# Patient Record
Sex: Female | Born: 1950 | Race: White | Hispanic: No | Marital: Single | Smoking: Current every day smoker
Health system: Southern US, Community
[De-identification: ages and names within clinical notes are randomized; demographics above are authoritative.]

## PROBLEM LIST (undated history)

## (undated) DIAGNOSIS — Z1389 Encounter for screening for other disorder: Secondary | ICD-10-CM

## (undated) DIAGNOSIS — K469 Unspecified abdominal hernia without obstruction or gangrene: Secondary | ICD-10-CM

## (undated) DIAGNOSIS — F329 Major depressive disorder, single episode, unspecified: Secondary | ICD-10-CM

## (undated) DIAGNOSIS — Z87891 Personal history of nicotine dependence: Secondary | ICD-10-CM

## (undated) DIAGNOSIS — E669 Obesity, unspecified: Secondary | ICD-10-CM

## (undated) DIAGNOSIS — Z1239 Encounter for other screening for malignant neoplasm of breast: Secondary | ICD-10-CM

## (undated) DIAGNOSIS — I10 Essential (primary) hypertension: Secondary | ICD-10-CM

## (undated) DIAGNOSIS — F319 Bipolar disorder, unspecified: Secondary | ICD-10-CM

## (undated) DIAGNOSIS — Z8739 Personal history of other diseases of the musculoskeletal system and connective tissue: Secondary | ICD-10-CM

## (undated) DIAGNOSIS — M1990 Unspecified osteoarthritis, unspecified site: Secondary | ICD-10-CM

## (undated) DIAGNOSIS — N6019 Diffuse cystic mastopathy of unspecified breast: Secondary | ICD-10-CM

## (undated) DIAGNOSIS — Z803 Family history of malignant neoplasm of breast: Secondary | ICD-10-CM

## (undated) DIAGNOSIS — E349 Endocrine disorder, unspecified: Secondary | ICD-10-CM

## (undated) DIAGNOSIS — N63 Unspecified lump in unspecified breast: Secondary | ICD-10-CM

## (undated) HISTORY — DX: Major depressive disorder, single episode, unspecified: F32.9

## (undated) HISTORY — DX: Bipolar disorder, unspecified: F31.9

## (undated) HISTORY — DX: Personal history of nicotine dependence: Z87.891

## (undated) HISTORY — DX: Unspecified abdominal hernia without obstruction or gangrene: K46.9

## (undated) HISTORY — DX: Obesity, unspecified: E66.9

## (undated) HISTORY — DX: Encounter for screening for other disorder: Z13.89

## (undated) HISTORY — DX: Endocrine disorder, unspecified: E34.9

## (undated) HISTORY — DX: Essential (primary) hypertension: I10

## (undated) HISTORY — DX: Encounter for other screening for malignant neoplasm of breast: Z12.39

## (undated) HISTORY — DX: Family history of malignant neoplasm of breast: Z80.3

## (undated) HISTORY — DX: Unspecified osteoarthritis, unspecified site: M19.90

## (undated) HISTORY — DX: Unspecified lump in unspecified breast: N63.0

## (undated) HISTORY — DX: Personal history of other diseases of the musculoskeletal system and connective tissue: Z87.39

## (undated) HISTORY — DX: Diffuse cystic mastopathy of unspecified breast: N60.19

---

## 1996-09-21 DIAGNOSIS — Z87891 Personal history of nicotine dependence: Secondary | ICD-10-CM

## 1996-09-21 HISTORY — DX: Personal history of nicotine dependence: Z87.891

## 2000-09-21 HISTORY — PX: BREAST SURGERY: SHX581

## 2003-12-25 HISTORY — PX: APPENDECTOMY: SHX54

## 2003-12-25 HISTORY — PX: TUBAL LIGATION: SHX77

## 2003-12-25 HISTORY — PX: SALPINGOOPHORECTOMY: SHX82

## 2005-04-02 ENCOUNTER — Emergency Department: Payer: Self-pay | Admitting: Emergency Medicine

## 2005-04-03 ENCOUNTER — Ambulatory Visit: Payer: Self-pay | Admitting: Emergency Medicine

## 2005-04-03 ENCOUNTER — Other Ambulatory Visit: Payer: Self-pay

## 2005-04-22 ENCOUNTER — Ambulatory Visit: Payer: Self-pay | Admitting: General Surgery

## 2005-07-06 ENCOUNTER — Ambulatory Visit: Payer: Self-pay

## 2005-07-20 ENCOUNTER — Ambulatory Visit: Payer: Self-pay | Admitting: Family Medicine

## 2005-07-27 DIAGNOSIS — M1990 Unspecified osteoarthritis, unspecified site: Secondary | ICD-10-CM

## 2005-07-27 DIAGNOSIS — K469 Unspecified abdominal hernia without obstruction or gangrene: Secondary | ICD-10-CM

## 2005-07-27 DIAGNOSIS — Z8739 Personal history of other diseases of the musculoskeletal system and connective tissue: Secondary | ICD-10-CM

## 2005-07-27 HISTORY — DX: Unspecified osteoarthritis, unspecified site: M19.90

## 2005-07-27 HISTORY — DX: Personal history of other diseases of the musculoskeletal system and connective tissue: Z87.39

## 2005-07-27 HISTORY — DX: Unspecified abdominal hernia without obstruction or gangrene: K46.9

## 2005-08-03 ENCOUNTER — Ambulatory Visit: Payer: Self-pay | Admitting: Family Medicine

## 2005-09-21 DIAGNOSIS — I10 Essential (primary) hypertension: Secondary | ICD-10-CM

## 2005-09-21 HISTORY — PX: ABDOMINAL HYSTERECTOMY: SHX81

## 2005-09-21 HISTORY — DX: Essential (primary) hypertension: I10

## 2006-05-31 ENCOUNTER — Ambulatory Visit: Payer: Self-pay | Admitting: General Surgery

## 2006-06-07 ENCOUNTER — Ambulatory Visit: Payer: Self-pay | Admitting: General Surgery

## 2006-07-22 ENCOUNTER — Ambulatory Visit: Payer: Self-pay | Admitting: Unknown Physician Specialty

## 2006-08-26 ENCOUNTER — Ambulatory Visit: Payer: Self-pay | Admitting: Unknown Physician Specialty

## 2006-12-27 ENCOUNTER — Ambulatory Visit: Payer: Self-pay

## 2007-06-06 ENCOUNTER — Ambulatory Visit: Payer: Self-pay | Admitting: General Surgery

## 2008-03-22 ENCOUNTER — Ambulatory Visit: Payer: Self-pay | Admitting: Family Medicine

## 2008-08-17 ENCOUNTER — Ambulatory Visit: Payer: Self-pay | Admitting: General Surgery

## 2008-09-21 DIAGNOSIS — F319 Bipolar disorder, unspecified: Secondary | ICD-10-CM

## 2008-09-21 HISTORY — DX: Bipolar disorder, unspecified: F31.9

## 2009-05-15 DIAGNOSIS — F32A Depression, unspecified: Secondary | ICD-10-CM

## 2009-05-15 HISTORY — DX: Depression, unspecified: F32.A

## 2009-08-28 ENCOUNTER — Ambulatory Visit: Payer: Self-pay

## 2010-03-18 ENCOUNTER — Emergency Department: Payer: Self-pay | Admitting: Unknown Physician Specialty

## 2010-04-23 ENCOUNTER — Ambulatory Visit: Payer: Self-pay | Admitting: Unknown Physician Specialty

## 2010-05-28 ENCOUNTER — Ambulatory Visit: Payer: Self-pay | Admitting: General Practice

## 2010-11-07 ENCOUNTER — Ambulatory Visit: Payer: Self-pay | Admitting: General Surgery

## 2010-11-17 ENCOUNTER — Ambulatory Visit: Payer: Self-pay | Admitting: General Surgery

## 2010-11-20 HISTORY — PX: BREAST SURGERY: SHX581

## 2010-12-11 DIAGNOSIS — N63 Unspecified lump in unspecified breast: Secondary | ICD-10-CM

## 2010-12-11 HISTORY — DX: Unspecified lump in unspecified breast: N63.0

## 2011-04-08 ENCOUNTER — Emergency Department: Payer: Self-pay | Admitting: Emergency Medicine

## 2011-04-29 ENCOUNTER — Ambulatory Visit: Payer: Self-pay | Admitting: Urology

## 2011-05-14 ENCOUNTER — Ambulatory Visit: Payer: Self-pay | Admitting: Urology

## 2011-05-27 ENCOUNTER — Ambulatory Visit: Payer: Self-pay | Admitting: Urology

## 2011-09-03 ENCOUNTER — Ambulatory Visit: Payer: Self-pay | Admitting: Urology

## 2011-11-16 ENCOUNTER — Ambulatory Visit: Payer: Self-pay | Admitting: General Surgery

## 2011-12-28 DIAGNOSIS — E349 Endocrine disorder, unspecified: Secondary | ICD-10-CM

## 2011-12-28 HISTORY — DX: Endocrine disorder, unspecified: E34.9

## 2011-12-30 DIAGNOSIS — Z803 Family history of malignant neoplasm of breast: Secondary | ICD-10-CM

## 2011-12-30 DIAGNOSIS — N6019 Diffuse cystic mastopathy of unspecified breast: Secondary | ICD-10-CM

## 2011-12-30 HISTORY — DX: Diffuse cystic mastopathy of unspecified breast: N60.19

## 2011-12-30 HISTORY — DX: Family history of malignant neoplasm of breast: Z80.3

## 2011-12-31 DIAGNOSIS — Z1239 Encounter for other screening for malignant neoplasm of breast: Secondary | ICD-10-CM

## 2011-12-31 DIAGNOSIS — Z1389 Encounter for screening for other disorder: Secondary | ICD-10-CM

## 2011-12-31 DIAGNOSIS — E669 Obesity, unspecified: Secondary | ICD-10-CM

## 2011-12-31 HISTORY — DX: Obesity, unspecified: E66.9

## 2011-12-31 HISTORY — DX: Encounter for other screening for malignant neoplasm of breast: Z12.39

## 2011-12-31 HISTORY — DX: Encounter for screening for other disorder: Z13.89

## 2012-04-15 ENCOUNTER — Ambulatory Visit: Payer: Self-pay | Admitting: Urology

## 2012-06-03 ENCOUNTER — Ambulatory Visit: Payer: Self-pay | Admitting: Family Medicine

## 2012-07-28 ENCOUNTER — Ambulatory Visit: Payer: Self-pay | Admitting: Family Medicine

## 2012-11-12 ENCOUNTER — Encounter: Payer: Self-pay | Admitting: *Deleted

## 2012-11-12 DIAGNOSIS — Z803 Family history of malignant neoplasm of breast: Secondary | ICD-10-CM | POA: Insufficient documentation

## 2012-12-21 ENCOUNTER — Ambulatory Visit: Payer: Self-pay | Admitting: General Surgery

## 2012-12-22 ENCOUNTER — Encounter: Payer: Self-pay | Admitting: General Surgery

## 2012-12-22 NOTE — Progress Notes (Signed)
Quick Note:  Make sure the additional views are done prior to her office visit ______ 

## 2012-12-23 NOTE — Progress Notes (Signed)
Patient is scheduled for a follow up appointment on 01-12-13 @ 2:30 pm.

## 2013-01-10 NOTE — Progress Notes (Signed)
Per Rebeca, patient did not have additional views completed. She will contact patient and cancel appointment that was scheduled for 01-12-13. Once this has been completed, we can get patient rescheduled for office visit.

## 2013-01-12 ENCOUNTER — Ambulatory Visit: Payer: Self-pay | Admitting: General Surgery

## 2013-01-19 ENCOUNTER — Encounter: Payer: Self-pay | Admitting: *Deleted

## 2013-03-27 IMAGING — CR DG ABDOMEN 1V
1 series · 1 of 1 positions shown · non-contrast
Comparison: none

REASON FOR EXAM: nephrolithiasis
COMMENTS:

[view not recorded]
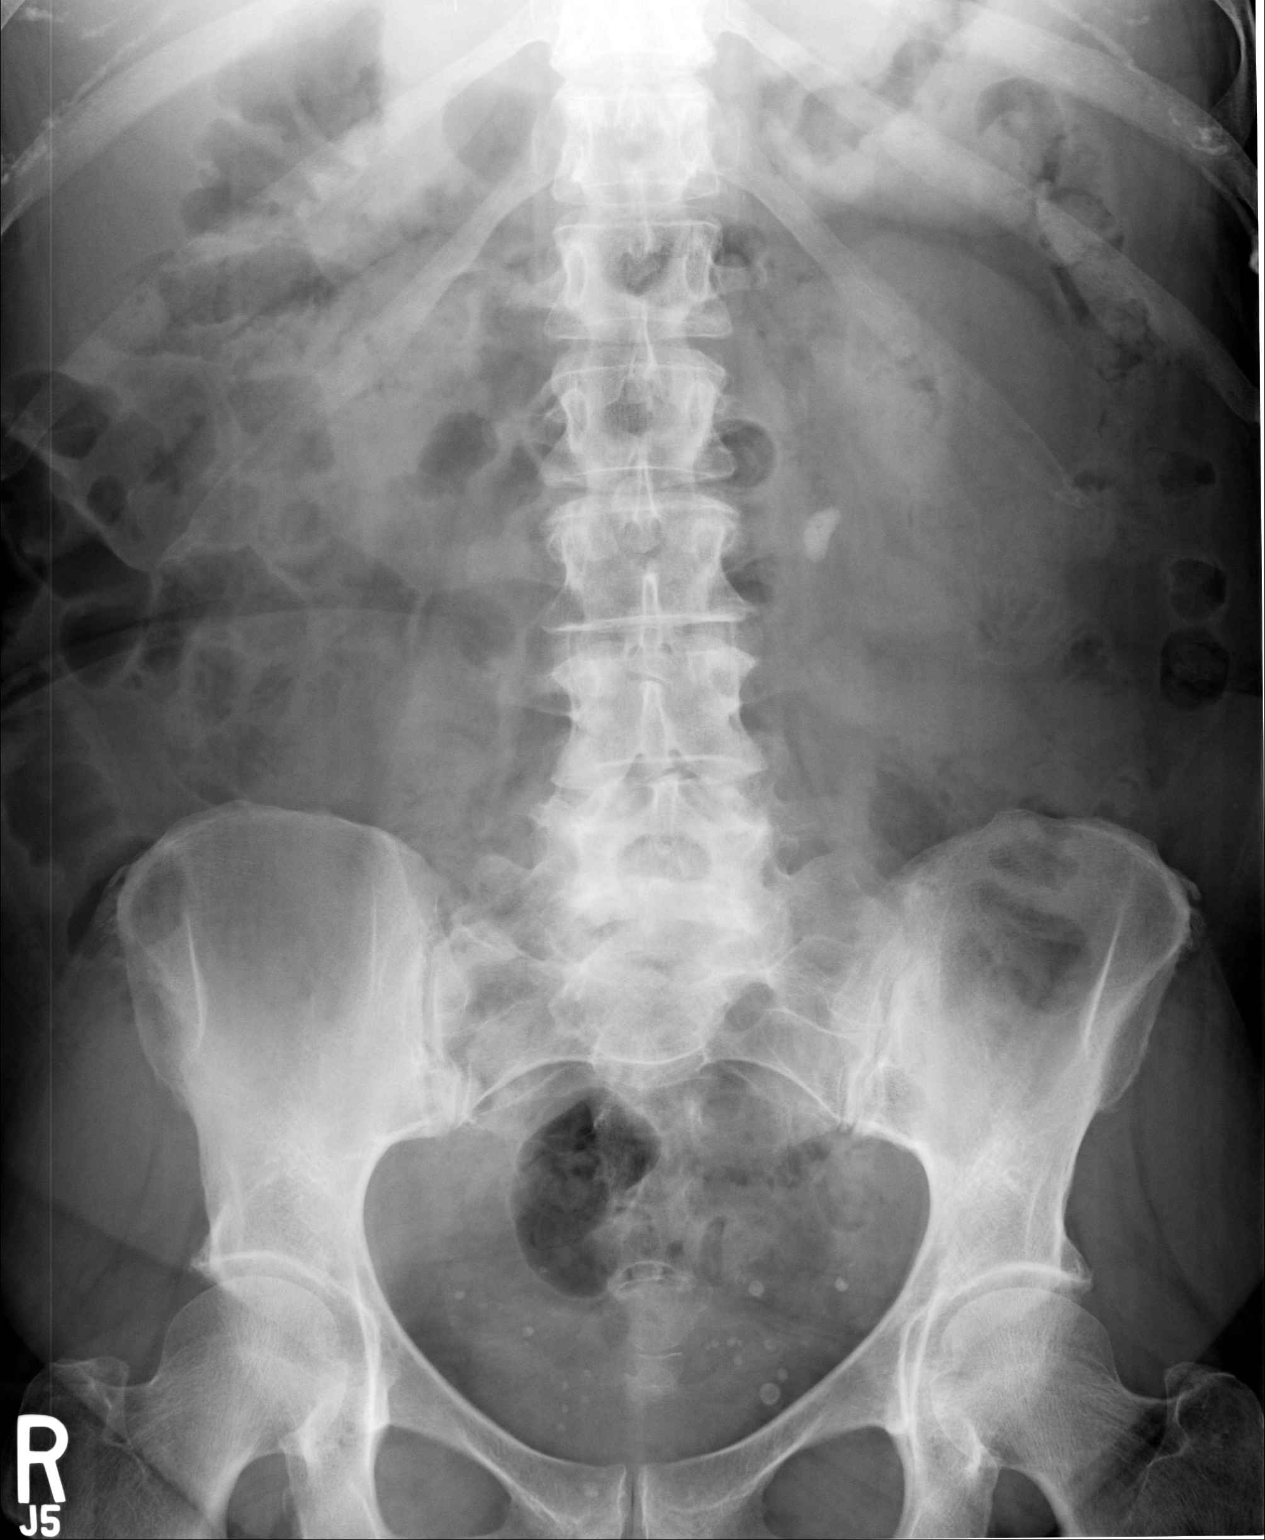

[1 of 1 positions shown; findings below may reference images not displayed]

PROCEDURE:     DXR - DXR KIDNEY URETER BLADDER  - April 29, 2011 [DATE]

RESULT:     Frontal view of the abdomen and pelvis is performed.

A persistent, calcified density once again projects within the midpole
region of the left kidney.

Air is seen within nondilated loops of large and small bowel. Rounded
densities project within the pelvis likely representing phleboliths.
IMPRESSION: 1.  Persistent left renal calculus.
2.  Nonobstructive bowel gas pattern.
3.  Likely phleboliths within the pelvis.

## 2013-06-28 ENCOUNTER — Emergency Department: Payer: Self-pay | Admitting: Emergency Medicine

## 2013-06-28 LAB — COMPREHENSIVE METABOLIC PANEL
Albumin: 3 g/dL — ABNORMAL LOW (ref 3.4–5.0)
Alkaline Phosphatase: 205 U/L — ABNORMAL HIGH (ref 50–136)
Anion Gap: 7 (ref 7–16)
Co2: 28 mmol/L (ref 21–32)
Potassium: 3.2 mmol/L — ABNORMAL LOW (ref 3.5–5.1)
Sodium: 133 mmol/L — ABNORMAL LOW (ref 136–145)

## 2013-06-28 LAB — CBC
HCT: 37.5 % (ref 35.0–47.0)
HGB: 12.8 g/dL (ref 12.0–16.0)
MCH: 36.5 pg — ABNORMAL HIGH (ref 26.0–34.0)
MCHC: 34.2 g/dL (ref 32.0–36.0)
Platelet: 70 10*3/uL — ABNORMAL LOW (ref 150–440)
RDW: 14.2 % (ref 11.5–14.5)
WBC: 6.6 10*3/uL (ref 3.6–11.0)

## 2014-04-30 ENCOUNTER — Emergency Department: Payer: Self-pay | Admitting: Emergency Medicine

## 2014-04-30 LAB — CBC WITH DIFFERENTIAL/PLATELET
BASOS PCT: 0.8 %
Basophil #: 0.1 10*3/uL (ref 0.0–0.1)
Eosinophil #: 0.1 10*3/uL (ref 0.0–0.7)
Eosinophil %: 1.4 %
HCT: 28.2 % — AB (ref 35.0–47.0)
HGB: 9.5 g/dL — ABNORMAL LOW (ref 12.0–16.0)
LYMPHS ABS: 1.6 10*3/uL (ref 1.0–3.6)
LYMPHS PCT: 21.6 %
MCH: 43.2 pg — AB (ref 26.0–34.0)
MCHC: 33.7 g/dL (ref 32.0–36.0)
MCV: 128 fL — ABNORMAL HIGH (ref 80–100)
MONO ABS: 0.6 x10 3/mm (ref 0.2–0.9)
Monocyte %: 8.4 %
NEUTROS PCT: 67.8 %
Neutrophil #: 5.2 10*3/uL (ref 1.4–6.5)
Platelet: 67 10*3/uL — ABNORMAL LOW (ref 150–440)
RBC: 2.2 10*6/uL — AB (ref 3.80–5.20)
RDW: 14.9 % — AB (ref 11.5–14.5)
WBC: 7.6 10*3/uL (ref 3.6–11.0)

## 2014-04-30 LAB — COMPREHENSIVE METABOLIC PANEL
ALBUMIN: 2.6 g/dL — AB (ref 3.4–5.0)
ANION GAP: 12 (ref 7–16)
AST: 102 U/L — AB (ref 15–37)
Alkaline Phosphatase: 171 U/L — ABNORMAL HIGH
BILIRUBIN TOTAL: 5 mg/dL — AB (ref 0.2–1.0)
BUN: 3 mg/dL — AB (ref 7–18)
CALCIUM: 8.4 mg/dL — AB (ref 8.5–10.1)
CREATININE: 0.71 mg/dL (ref 0.60–1.30)
Chloride: 108 mmol/L — ABNORMAL HIGH (ref 98–107)
Co2: 25 mmol/L (ref 21–32)
EGFR (Non-African Amer.): >60
Glucose: 166 mg/dL — ABNORMAL HIGH (ref 65–99)
Osmolality: 289 (ref 275–301)
POTASSIUM: 3.5 mmol/L (ref 3.5–5.1)
SGPT (ALT): 27 U/L
SODIUM: 145 mmol/L (ref 136–145)
Total Protein: 6.9 g/dL (ref 6.4–8.2)

## 2014-04-30 LAB — ETHANOL
ETHANOL %: 0.272 % — AB (ref 0.000–0.080)
ETHANOL %: 0.15 % — AB (ref 0.000–0.080)
ETHANOL LVL: 272 mg/dL
Ethanol %: 0.088 % — ABNORMAL HIGH (ref 0.000–0.080)
Ethanol: 150 mg/dL
Ethanol: 88 mg/dL

## 2014-05-02 ENCOUNTER — Emergency Department: Payer: Self-pay | Admitting: Emergency Medicine

## 2014-05-02 LAB — ETHANOL
ETHANOL %: 0.114 % — AB (ref 0.000–0.080)
ETHANOL LVL: 178 mg/dL
Ethanol %: 0.178 % — ABNORMAL HIGH (ref 0.000–0.080)
Ethanol: 114 mg/dL

## 2014-05-02 LAB — DRUG SCREEN, URINE

## 2014-05-02 LAB — COMPREHENSIVE METABOLIC PANEL
ALBUMIN: 2.8 g/dL — AB (ref 3.4–5.0)
AST: 90 U/L — AB (ref 15–37)
Alkaline Phosphatase: 144 U/L — ABNORMAL HIGH
Anion Gap: 11 (ref 7–16)
BILIRUBIN TOTAL: 10.6 mg/dL — AB (ref 0.2–1.0)
BUN: 3 mg/dL — AB (ref 7–18)
CHLORIDE: 101 mmol/L (ref 98–107)
CO2: 24 mmol/L (ref 21–32)
Calcium, Total: 9 mg/dL (ref 8.5–10.1)
Creatinine: 0.49 mg/dL — ABNORMAL LOW (ref 0.60–1.30)
EGFR (Non-African Amer.): >60
GLUCOSE: 144 mg/dL — AB (ref 65–99)
OSMOLALITY: 271 (ref 275–301)
Potassium: 3.4 mmol/L — ABNORMAL LOW (ref 3.5–5.1)
SGPT (ALT): 22 U/L
Sodium: 136 mmol/L (ref 136–145)
TOTAL PROTEIN: 7 g/dL (ref 6.4–8.2)

## 2014-05-02 LAB — URINALYSIS, COMPLETE
BILIRUBIN, UR: NEGATIVE
Blood: NEGATIVE
Glucose,UR: NEGATIVE mg/dL (ref 0–75)
Ketone: NEGATIVE
LEUKOCYTE ESTERASE: NEGATIVE
Nitrite: NEGATIVE
Ph: 6 (ref 4.5–8.0)
Protein: NEGATIVE
RBC,UR: 1 /HPF (ref 0–5)
Specific Gravity: 1.004 (ref 1.003–1.030)
Squamous Epithelial: 8
WBC UR: 1 /HPF (ref 0–5)

## 2014-05-02 LAB — CBC
HCT: 28.8 % — AB (ref 35.0–47.0)
HGB: 9.7 g/dL — AB (ref 12.0–16.0)
MCH: 43 pg — AB (ref 26.0–34.0)
MCHC: 33.8 g/dL (ref 32.0–36.0)
MCV: 127 fL — ABNORMAL HIGH (ref 80–100)
Platelet: 73 10*3/uL — ABNORMAL LOW (ref 150–440)
RBC: 2.26 10*6/uL — ABNORMAL LOW (ref 3.80–5.20)
RDW: 14.3 % (ref 11.5–14.5)
WBC: 9.9 10*3/uL (ref 3.6–11.0)

## 2014-05-02 LAB — SALICYLATE LEVEL: Salicylates, Serum: <1.7 mg/dL

## 2014-05-02 LAB — ACETAMINOPHEN LEVEL: Acetaminophen: <2 ug/mL

## 2014-05-03 LAB — URINALYSIS, COMPLETE
BILIRUBIN, UR: NEGATIVE
Glucose,UR: NEGATIVE mg/dL (ref 0–75)
Ketone: NEGATIVE
NITRITE: NEGATIVE
Ph: 6 (ref 4.5–8.0)
Protein: NEGATIVE
RBC,UR: 2 /HPF (ref 0–5)
SPECIFIC GRAVITY: 1.009 (ref 1.003–1.030)
Squamous Epithelial: 8

## 2014-05-03 LAB — BASIC METABOLIC PANEL
Anion Gap: 10 (ref 7–16)
BUN: 4 mg/dL — ABNORMAL LOW (ref 7–18)
CALCIUM: 8.1 mg/dL — AB (ref 8.5–10.1)
CO2: 28 mmol/L (ref 21–32)
Chloride: 103 mmol/L (ref 98–107)
Creatinine: 0.8 mg/dL (ref 0.60–1.30)
EGFR (Non-African Amer.): >60
Glucose: 118 mg/dL — ABNORMAL HIGH (ref 65–99)
Osmolality: 279 (ref 275–301)
POTASSIUM: 3.5 mmol/L (ref 3.5–5.1)
Sodium: 141 mmol/L (ref 136–145)

## 2014-05-03 LAB — CBC WITH DIFFERENTIAL/PLATELET
Basophil #: 0.1 10*3/uL (ref 0.0–0.1)
Basophil %: 1.9 %
EOS PCT: 2.8 %
Eosinophil #: 0.2 10*3/uL (ref 0.0–0.7)
HCT: 25.6 % — ABNORMAL LOW (ref 35.0–47.0)
HGB: 8.6 g/dL — AB (ref 12.0–16.0)
Lymphocyte #: 1.2 10*3/uL (ref 1.0–3.6)
Lymphocyte %: 16.7 %
MCH: 42.9 pg — AB (ref 26.0–34.0)
MCHC: 33.6 g/dL (ref 32.0–36.0)
MCV: 128 fL — AB (ref 80–100)
MONOS PCT: 8 %
Monocyte #: 0.6 x10 3/mm (ref 0.2–0.9)
NEUTROS PCT: 70.6 %
Neutrophil #: 5.2 10*3/uL (ref 1.4–6.5)
PLATELETS: 73 10*3/uL — AB (ref 150–440)
RBC: 2 10*6/uL — ABNORMAL LOW (ref 3.80–5.20)
RDW: 14.6 % — AB (ref 11.5–14.5)
WBC: 7.4 10*3/uL (ref 3.6–11.0)

## 2014-05-04 ENCOUNTER — Inpatient Hospital Stay: Payer: Self-pay | Admitting: Internal Medicine

## 2014-05-04 LAB — TROPONIN I

## 2014-05-04 LAB — HEPATIC FUNCTION PANEL A (ARMC)
ALK PHOS: 174 U/L — AB
Albumin: 2.5 g/dL — ABNORMAL LOW (ref 3.4–5.0)
Bilirubin, Direct: 3.4 mg/dL — ABNORMAL HIGH (ref 0.00–0.20)
Bilirubin,Total: 6.4 mg/dL — ABNORMAL HIGH (ref 0.2–1.0)
SGOT(AST): 64 U/L — ABNORMAL HIGH (ref 15–37)
SGPT (ALT): 18 U/L
Total Protein: 6.4 g/dL (ref 6.4–8.2)

## 2014-05-04 LAB — TSH: Thyroid Stimulating Horm: 7 u[IU]/mL — ABNORMAL HIGH

## 2014-05-04 LAB — HEMOGLOBIN: HGB: 8 g/dL — AB (ref 12.0–16.0)

## 2014-05-04 LAB — PROTIME-INR
INR: 1.9
PROTHROMBIN TIME: 21.2 s — AB (ref 11.5–14.7)

## 2014-05-04 LAB — AMMONIA: Ammonia, Plasma: 12 mcmol/L (ref 11–32)

## 2014-05-05 LAB — CBC WITH DIFFERENTIAL/PLATELET
Basophil #: 0.2 10*3/uL — ABNORMAL HIGH (ref 0.0–0.1)
Basophil %: 2.4 %
EOS ABS: 0 10*3/uL (ref 0.0–0.7)
Eosinophil %: 0.5 %
HCT: 23.9 % — AB (ref 35.0–47.0)
HGB: 8 g/dL — AB (ref 12.0–16.0)
Lymphocyte #: 1.2 10*3/uL (ref 1.0–3.6)
Lymphocyte %: 18 %
MCH: 42.5 pg — ABNORMAL HIGH (ref 26.0–34.0)
MCHC: 33.3 g/dL (ref 32.0–36.0)
MCV: 128 fL — ABNORMAL HIGH (ref 80–100)
Monocyte #: 0.3 x10 3/mm (ref 0.2–0.9)
Monocyte %: 5.1 %
Neutrophil #: 5.1 10*3/uL (ref 1.4–6.5)
Neutrophil %: 74 %
Platelet: 65 10*3/uL — ABNORMAL LOW (ref 150–440)
RBC: 1.87 10*6/uL — AB (ref 3.80–5.20)
RDW: 14.7 % — AB (ref 11.5–14.5)
WBC: 6.9 10*3/uL (ref 3.6–11.0)

## 2014-05-05 LAB — COMPREHENSIVE METABOLIC PANEL
ALK PHOS: 114 U/L
Albumin: 2.3 g/dL — ABNORMAL LOW (ref 3.4–5.0)
Anion Gap: 8 (ref 7–16)
BILIRUBIN TOTAL: 8 mg/dL — AB (ref 0.2–1.0)
BUN: 3 mg/dL — ABNORMAL LOW (ref 7–18)
Calcium, Total: 7.9 mg/dL — ABNORMAL LOW (ref 8.5–10.1)
Chloride: 105 mmol/L (ref 98–107)
Co2: 26 mmol/L (ref 21–32)
Creatinine: 0.62 mg/dL (ref 0.60–1.30)
EGFR (African American): >60
GLUCOSE: 126 mg/dL — AB (ref 65–99)
Osmolality: 276 (ref 275–301)
Potassium: 4.2 mmol/L (ref 3.5–5.1)
SGOT(AST): 61 U/L — ABNORMAL HIGH (ref 15–37)
SGPT (ALT): 16 U/L
SODIUM: 139 mmol/L (ref 136–145)
Total Protein: 6.1 g/dL — ABNORMAL LOW (ref 6.4–8.2)

## 2014-05-05 LAB — URINALYSIS, COMPLETE
Bacteria: NONE SEEN
Bilirubin,UR: NEGATIVE
Blood: NEGATIVE
GLUCOSE, UR: NEGATIVE mg/dL (ref 0–75)
Ketone: NEGATIVE
LEUKOCYTE ESTERASE: NEGATIVE
Nitrite: NEGATIVE
Ph: 5 (ref 4.5–8.0)
Protein: NEGATIVE
Specific Gravity: 1.01 (ref 1.003–1.030)
Squamous Epithelial: 2
WBC UR: 3 /HPF (ref 0–5)

## 2014-05-05 LAB — HEMOGLOBIN: HGB: 8.3 g/dL — ABNORMAL LOW (ref 12.0–16.0)

## 2014-05-06 LAB — BASIC METABOLIC PANEL
Anion Gap: 8 (ref 7–16)
BUN: 5 mg/dL — ABNORMAL LOW (ref 7–18)
Calcium, Total: 8 mg/dL — ABNORMAL LOW (ref 8.5–10.1)
Chloride: 105 mmol/L (ref 98–107)
Co2: 27 mmol/L (ref 21–32)
Creatinine: 0.72 mg/dL (ref 0.60–1.30)
EGFR (African American): >60
EGFR (Non-African Amer.): >60
Glucose: 118 mg/dL — ABNORMAL HIGH (ref 65–99)
Osmolality: 278 (ref 275–301)
Potassium: 4 mmol/L (ref 3.5–5.1)
Sodium: 140 mmol/L (ref 136–145)

## 2014-05-06 LAB — CBC WITH DIFFERENTIAL/PLATELET
BASOS ABS: 0.1 10*3/uL (ref 0.0–0.1)
BASOS PCT: 0.9 %
EOS PCT: 1 %
Eosinophil #: 0.1 10*3/uL (ref 0.0–0.7)
HCT: 22.9 % — ABNORMAL LOW (ref 35.0–47.0)
HGB: 7.8 g/dL — ABNORMAL LOW (ref 12.0–16.0)
LYMPHS ABS: 1.3 10*3/uL (ref 1.0–3.6)
LYMPHS PCT: 18.3 %
MCH: 43.4 pg — ABNORMAL HIGH (ref 26.0–34.0)
MCHC: 34.2 g/dL (ref 32.0–36.0)
MCV: 127 fL — ABNORMAL HIGH (ref 80–100)
MONO ABS: 0.5 x10 3/mm (ref 0.2–0.9)
Monocyte %: 7.6 %
NEUTROS ABS: 5.2 10*3/uL (ref 1.4–6.5)
Neutrophil %: 72.2 %
PLATELETS: 64 10*3/uL — AB (ref 150–440)
RBC: 1.8 10*6/uL — AB (ref 3.80–5.20)
RDW: 14.7 % — AB (ref 11.5–14.5)
WBC: 7.2 10*3/uL (ref 3.6–11.0)

## 2014-05-07 LAB — COMPREHENSIVE METABOLIC PANEL
ALBUMIN: 2.4 g/dL — AB (ref 3.4–5.0)
ALT: 16 U/L
Alkaline Phosphatase: 119 U/L — ABNORMAL HIGH
Anion Gap: 6 — ABNORMAL LOW (ref 7–16)
BUN: 6 mg/dL — AB (ref 7–18)
Bilirubin,Total: 6.2 mg/dL — ABNORMAL HIGH (ref 0.2–1.0)
Calcium, Total: 8.3 mg/dL — ABNORMAL LOW (ref 8.5–10.1)
Chloride: 103 mmol/L (ref 98–107)
Co2: 27 mmol/L (ref 21–32)
Creatinine: 0.63 mg/dL (ref 0.60–1.30)
EGFR (Non-African Amer.): >60
Glucose: 110 mg/dL — ABNORMAL HIGH (ref 65–99)
Osmolality: 270 (ref 275–301)
Potassium: 3.9 mmol/L (ref 3.5–5.1)
SGOT(AST): 48 U/L — ABNORMAL HIGH (ref 15–37)
Sodium: 136 mmol/L (ref 136–145)
TOTAL PROTEIN: 5.9 g/dL — AB (ref 6.4–8.2)

## 2014-05-07 LAB — CBC WITH DIFFERENTIAL/PLATELET
Basophil #: 0.1 10*3/uL (ref 0.0–0.1)
Basophil %: 0.9 %
Eosinophil #: 0 10*3/uL (ref 0.0–0.7)
Eosinophil %: 0.7 %
HCT: 22.8 % — AB (ref 35.0–47.0)
HGB: 7.9 g/dL — ABNORMAL LOW (ref 12.0–16.0)
LYMPHS PCT: 16.7 %
Lymphocyte #: 1.1 10*3/uL (ref 1.0–3.6)
MCH: 43.2 pg — AB (ref 26.0–34.0)
MCHC: 34.5 g/dL (ref 32.0–36.0)
MCV: 125 fL — ABNORMAL HIGH (ref 80–100)
MONOS PCT: 7.7 %
Monocyte #: 0.5 x10 3/mm (ref 0.2–0.9)
NEUTROS PCT: 74 %
Neutrophil #: 4.8 10*3/uL (ref 1.4–6.5)
PLATELETS: 59 10*3/uL — AB (ref 150–440)
RBC: 1.82 10*6/uL — ABNORMAL LOW (ref 3.80–5.20)
RDW: 14.5 % (ref 11.5–14.5)
WBC: 6.5 10*3/uL (ref 3.6–11.0)

## 2014-05-10 LAB — CULTURE, BLOOD (SINGLE)

## 2014-06-21 ENCOUNTER — Ambulatory Visit: Payer: Self-pay | Admitting: Internal Medicine

## 2014-07-13 ENCOUNTER — Inpatient Hospital Stay: Payer: Self-pay | Admitting: Internal Medicine

## 2014-07-13 LAB — DRUG SCREEN, URINE
Amphetamines, Ur Screen: NEGATIVE (ref ?–1000)
BARBITURATES, UR SCREEN: NEGATIVE (ref ?–200)
Benzodiazepine, Ur Scrn: NEGATIVE (ref ?–200)
COCAINE METABOLITE, UR ~~LOC~~: NEGATIVE (ref ?–300)
Cannabinoid 50 Ng, Ur ~~LOC~~: NEGATIVE (ref ?–50)
MDMA (ECSTASY) UR SCREEN: NEGATIVE (ref ?–500)
Methadone, Ur Screen: NEGATIVE (ref ?–300)
Opiate, Ur Screen: NEGATIVE (ref ?–300)
Phencyclidine (PCP) Ur S: NEGATIVE (ref ?–25)
TRICYCLIC, UR SCREEN: NEGATIVE (ref ?–1000)

## 2014-07-13 LAB — URINALYSIS, COMPLETE
GLUCOSE, UR: NEGATIVE mg/dL (ref 0–75)
KETONE: NEGATIVE
Leukocyte Esterase: NEGATIVE
NITRITE: NEGATIVE
Ph: 7 (ref 4.5–8.0)
RBC,UR: 120 /HPF (ref 0–5)
Specific Gravity: 1.018 (ref 1.003–1.030)

## 2014-07-13 LAB — COMPREHENSIVE METABOLIC PANEL
ANION GAP: 7 (ref 7–16)
Albumin: 2.7 g/dL — ABNORMAL LOW (ref 3.4–5.0)
Alkaline Phosphatase: 120 U/L — ABNORMAL HIGH
BUN: 8 mg/dL (ref 7–18)
Bilirubin,Total: 15.3 mg/dL — ABNORMAL HIGH (ref 0.2–1.0)
CALCIUM: 8.4 mg/dL — AB (ref 8.5–10.1)
Chloride: 101 mmol/L (ref 98–107)
Co2: 27 mmol/L (ref 21–32)
Creatinine: 0.48 mg/dL — ABNORMAL LOW (ref 0.60–1.30)
EGFR (Non-African Amer.): >60
Glucose: 133 mg/dL — ABNORMAL HIGH (ref 65–99)
OSMOLALITY: 270 (ref 275–301)
Potassium: 3.8 mmol/L (ref 3.5–5.1)
SGOT(AST): 76 U/L — ABNORMAL HIGH (ref 15–37)
SGPT (ALT): 25 U/L
Sodium: 135 mmol/L — ABNORMAL LOW (ref 136–145)
Total Protein: 6.1 g/dL — ABNORMAL LOW (ref 6.4–8.2)

## 2014-07-13 LAB — CBC
HCT: 21.5 % — ABNORMAL LOW (ref 35.0–47.0)
HGB: 7.4 g/dL — AB (ref 12.0–16.0)
MCH: 42.9 pg — ABNORMAL HIGH (ref 26.0–34.0)
MCHC: 34.5 g/dL (ref 32.0–36.0)
MCV: 124 fL — ABNORMAL HIGH (ref 80–100)
Platelet: 52 10*3/uL — ABNORMAL LOW (ref 150–440)
RBC: 1.73 10*6/uL — AB (ref 3.80–5.20)
RDW: 15.8 % — ABNORMAL HIGH (ref 11.5–14.5)
WBC: 5 10*3/uL (ref 3.6–11.0)

## 2014-07-13 LAB — LIPASE, BLOOD: Lipase: 134 U/L (ref 73–393)

## 2014-07-13 LAB — ETHANOL: Ethanol: <3 mg/dL (ref 0–80)

## 2014-07-13 LAB — TSH: Thyroid Stimulating Horm: 2.62 u[IU]/mL

## 2014-07-14 LAB — MAGNESIUM: MAGNESIUM: 1.9 mg/dL

## 2014-07-14 LAB — CBC WITH DIFFERENTIAL/PLATELET
BASOS PCT: 2.4 %
Basophil #: 0.1 10*3/uL (ref 0.0–0.1)
EOS PCT: 2.8 %
Eosinophil #: 0.1 10*3/uL (ref 0.0–0.7)
HCT: 17.9 % — ABNORMAL LOW (ref 35.0–47.0)
HGB: 6 g/dL — ABNORMAL LOW (ref 12.0–16.0)
LYMPHS PCT: 20.6 %
Lymphocyte #: 0.9 10*3/uL — ABNORMAL LOW (ref 1.0–3.6)
MCH: 41.9 pg — AB (ref 26.0–34.0)
MCHC: 33.3 g/dL (ref 32.0–36.0)
MCV: 126 fL — ABNORMAL HIGH (ref 80–100)
MONO ABS: 0.4 x10 3/mm (ref 0.2–0.9)
Monocyte %: 9.2 %
NEUTROS ABS: 3 10*3/uL (ref 1.4–6.5)
NEUTROS PCT: 65 %
Platelet: 45 10*3/uL — ABNORMAL LOW (ref 150–440)
RBC: 1.42 10*6/uL — AB (ref 3.80–5.20)
RDW: 15.5 % — AB (ref 11.5–14.5)
WBC: 4.6 10*3/uL (ref 3.6–11.0)

## 2014-07-14 LAB — COMPREHENSIVE METABOLIC PANEL
ALBUMIN: 2.3 g/dL — AB (ref 3.4–5.0)
ALK PHOS: 94 U/L
Anion Gap: 7 (ref 7–16)
BUN: 7 mg/dL (ref 7–18)
Bilirubin,Total: 12.5 mg/dL — ABNORMAL HIGH (ref 0.2–1.0)
CHLORIDE: 105 mmol/L (ref 98–107)
Calcium, Total: 7.7 mg/dL — ABNORMAL LOW (ref 8.5–10.1)
Co2: 27 mmol/L (ref 21–32)
Creatinine: 0.43 mg/dL — ABNORMAL LOW (ref 0.60–1.30)
EGFR (African American): >60
EGFR (Non-African Amer.): >60
Glucose: 100 mg/dL — ABNORMAL HIGH (ref 65–99)
OSMOLALITY: 276 (ref 275–301)
Potassium: 3.6 mmol/L (ref 3.5–5.1)
SGOT(AST): 57 U/L — ABNORMAL HIGH (ref 15–37)
SGPT (ALT): 20 U/L
Sodium: 139 mmol/L (ref 136–145)
Total Protein: 5.3 g/dL — ABNORMAL LOW (ref 6.4–8.2)

## 2014-07-14 LAB — BILIRUBIN, DIRECT: BILIRUBIN DIRECT: 3.6 mg/dL — AB (ref 0.0–0.2)

## 2014-07-15 LAB — CBC WITH DIFFERENTIAL/PLATELET
EOS PCT: 2 %
HCT: 22.2 % — AB (ref 35.0–47.0)
HGB: 7.6 g/dL — ABNORMAL LOW (ref 12.0–16.0)
LYMPHS PCT: 26 %
MCH: 38.6 pg — ABNORMAL HIGH (ref 26.0–34.0)
MCHC: 34 g/dL (ref 32.0–36.0)
MCV: 114 fL — AB (ref 80–100)
Monocytes: 4 %
PLATELETS: 61 10*3/uL — AB (ref 150–440)
RBC: 1.96 10*6/uL — AB (ref 3.80–5.20)
RDW: 25.6 % — ABNORMAL HIGH (ref 11.5–14.5)
Segmented Neutrophils: 68 %
WBC: 6.1 10*3/uL (ref 3.6–11.0)

## 2014-07-15 LAB — PROTIME-INR
INR: 2.1
Prothrombin Time: 22.8 secs — ABNORMAL HIGH (ref 11.5–14.7)

## 2014-07-15 LAB — HEPATIC FUNCTION PANEL A (ARMC)
ALBUMIN: 2.4 g/dL — AB (ref 3.4–5.0)
ALK PHOS: 111 U/L
Bilirubin, Direct: 3.3 mg/dL — ABNORMAL HIGH (ref 0.0–0.2)
Bilirubin,Total: 10.7 mg/dL — ABNORMAL HIGH (ref 0.2–1.0)
SGOT(AST): 56 U/L — ABNORMAL HIGH (ref 15–37)
SGPT (ALT): 19 U/L
TOTAL PROTEIN: 5.5 g/dL — AB (ref 6.4–8.2)

## 2014-07-16 LAB — COMPREHENSIVE METABOLIC PANEL
ALT: 19 U/L
Albumin: 2.2 g/dL — ABNORMAL LOW (ref 3.4–5.0)
Alkaline Phosphatase: 102 U/L
Anion Gap: 6 — ABNORMAL LOW (ref 7–16)
BILIRUBIN TOTAL: 9.6 mg/dL — AB (ref 0.2–1.0)
BUN: 5 mg/dL — ABNORMAL LOW (ref 7–18)
CALCIUM: 8 mg/dL — AB (ref 8.5–10.1)
CO2: 25 mmol/L (ref 21–32)
CREATININE: 0.47 mg/dL — AB (ref 0.60–1.30)
Chloride: 106 mmol/L (ref 98–107)
EGFR (African American): >60
EGFR (Non-African Amer.): >60
Glucose: 119 mg/dL — ABNORMAL HIGH (ref 65–99)
OSMOLALITY: 272 (ref 275–301)
Potassium: 3.6 mmol/L (ref 3.5–5.1)
SGOT(AST): 42 U/L — ABNORMAL HIGH (ref 15–37)
Sodium: 137 mmol/L (ref 136–145)
TOTAL PROTEIN: 5.3 g/dL — AB (ref 6.4–8.2)

## 2014-07-16 LAB — CBC WITH DIFFERENTIAL/PLATELET
BASOS ABS: 0.1 10*3/uL (ref 0.0–0.1)
Basophil %: 1 %
Eosinophil #: 0 10*3/uL (ref 0.0–0.7)
Eosinophil %: 0.5 %
HCT: 21.2 % — AB (ref 35.0–47.0)
HGB: 7.2 g/dL — AB (ref 12.0–16.0)
LYMPHS ABS: 1 10*3/uL (ref 1.0–3.6)
Lymphocyte %: 15.1 %
MCH: 38.6 pg — ABNORMAL HIGH (ref 26.0–34.0)
MCHC: 33.8 g/dL (ref 32.0–36.0)
MCV: 114 fL — ABNORMAL HIGH (ref 80–100)
Monocyte #: 0.4 x10 3/mm (ref 0.2–0.9)
Monocyte %: 7 %
NEUTROS ABS: 4.9 10*3/uL (ref 1.4–6.5)
NEUTROS PCT: 76.4 %
RBC: 1.85 10*6/uL — ABNORMAL LOW (ref 3.80–5.20)
RDW: 24.7 % — AB (ref 11.5–14.5)
WBC: 6.4 10*3/uL (ref 3.6–11.0)

## 2014-07-16 LAB — PROTIME-INR
INR: 2.1
Prothrombin Time: 22.9 secs — ABNORMAL HIGH (ref 11.5–14.7)

## 2014-07-20 LAB — CULTURE, BLOOD (SINGLE)

## 2014-07-22 ENCOUNTER — Ambulatory Visit: Payer: Self-pay | Admitting: Internal Medicine

## 2014-07-23 ENCOUNTER — Encounter: Payer: Self-pay | Admitting: *Deleted

## 2014-08-12 LAB — COMPREHENSIVE METABOLIC PANEL
ALT: 25 U/L
AST: 69 U/L — AB (ref 15–37)
Albumin: 2.7 g/dL — ABNORMAL LOW (ref 3.4–5.0)
Alkaline Phosphatase: 110 U/L
Anion Gap: 10 (ref 7–16)
BILIRUBIN TOTAL: 14.3 mg/dL — AB (ref 0.2–1.0)
BUN: 4 mg/dL — AB (ref 7–18)
CHLORIDE: 101 mmol/L (ref 98–107)
CREATININE: 0.82 mg/dL (ref 0.60–1.30)
Calcium, Total: 8.3 mg/dL — ABNORMAL LOW (ref 8.5–10.1)
Co2: 27 mmol/L (ref 21–32)
EGFR (African American): >60
EGFR (Non-African Amer.): >60
Glucose: 122 mg/dL — ABNORMAL HIGH (ref 65–99)
Osmolality: 274 (ref 275–301)
Potassium: 3.5 mmol/L (ref 3.5–5.1)
SODIUM: 138 mmol/L (ref 136–145)
TOTAL PROTEIN: 6.6 g/dL (ref 6.4–8.2)

## 2014-08-12 LAB — CBC WITH DIFFERENTIAL/PLATELET
BASOS ABS: 0.1 10*3/uL (ref 0.0–0.1)
Basophil %: 0.9 %
Eosinophil #: 0.1 10*3/uL (ref 0.0–0.7)
Eosinophil %: 0.8 %
HCT: 27.6 % — ABNORMAL LOW (ref 35.0–47.0)
HGB: 9.4 g/dL — ABNORMAL LOW (ref 12.0–16.0)
LYMPHS PCT: 11.9 %
Lymphocyte #: 1.4 10*3/uL (ref 1.0–3.6)
MCH: 39.1 pg — ABNORMAL HIGH (ref 26.0–34.0)
MCHC: 34.1 g/dL (ref 32.0–36.0)
MCV: 115 fL — AB (ref 80–100)
Monocyte #: 0.6 x10 3/mm (ref 0.2–0.9)
Monocyte %: 5 %
NEUTROS ABS: 9.5 10*3/uL — AB (ref 1.4–6.5)
NEUTROS PCT: 81.4 %
Platelet: 97 10*3/uL — ABNORMAL LOW (ref 150–440)
RBC: 2.41 10*6/uL — ABNORMAL LOW (ref 3.80–5.20)
RDW: 25.3 % — ABNORMAL HIGH (ref 11.5–14.5)
WBC: 11.6 10*3/uL — AB (ref 3.6–11.0)

## 2014-08-12 LAB — TROPONIN I: Troponin-I: <0.02 ng/mL

## 2014-08-12 LAB — LIPASE, BLOOD: Lipase: 158 U/L (ref 73–393)

## 2014-08-13 ENCOUNTER — Inpatient Hospital Stay: Payer: Self-pay | Admitting: Internal Medicine

## 2014-08-13 LAB — PROTIME-INR
INR: 1.8
INR: 2
Prothrombin Time: 20.4 secs — ABNORMAL HIGH (ref 11.5–14.7)
Prothrombin Time: 21.8 secs — ABNORMAL HIGH (ref 11.5–14.7)

## 2014-08-13 LAB — BODY FLUID CELL COUNT WITH DIFFERENTIAL
Basophil: 0 %
Eosinophil: 0 %
Lymphocytes: 4 %
Neutrophils: 93 %
Nucleated Cell Count: 842 /mm3
Other Cells BF: 0 %
Other Mononuclear Cells: 3 %

## 2014-08-13 LAB — COMPREHENSIVE METABOLIC PANEL
ALBUMIN: 2.4 g/dL — AB (ref 3.4–5.0)
ALK PHOS: 94 U/L
ALT: 21 U/L
Anion Gap: 7 (ref 7–16)
BILIRUBIN TOTAL: 13.4 mg/dL — AB (ref 0.2–1.0)
BUN: 7 mg/dL (ref 7–18)
Calcium, Total: 8 mg/dL — ABNORMAL LOW (ref 8.5–10.1)
Chloride: 103 mmol/L (ref 98–107)
Co2: 29 mmol/L (ref 21–32)
Creatinine: 0.83 mg/dL (ref 0.60–1.30)
EGFR (African American): >60
GLUCOSE: 116 mg/dL — AB (ref 65–99)
Osmolality: 276 (ref 275–301)
Potassium: 3.7 mmol/L (ref 3.5–5.1)
SGOT(AST): 56 U/L — ABNORMAL HIGH (ref 15–37)
SODIUM: 139 mmol/L (ref 136–145)
TOTAL PROTEIN: 6 g/dL — AB (ref 6.4–8.2)

## 2014-08-13 LAB — CBC WITH DIFFERENTIAL/PLATELET
Basophil #: 0.1 10*3/uL (ref 0.0–0.1)
Basophil %: 0.5 %
EOS ABS: 0.1 10*3/uL (ref 0.0–0.7)
EOS PCT: 1 %
HCT: 24.1 % — ABNORMAL LOW (ref 35.0–47.0)
HGB: 8.1 g/dL — AB (ref 12.0–16.0)
Lymphocyte #: 1.1 10*3/uL (ref 1.0–3.6)
Lymphocyte %: 10.2 %
MCH: 38.7 pg — ABNORMAL HIGH (ref 26.0–34.0)
MCHC: 33.6 g/dL (ref 32.0–36.0)
MCV: 115 fL — ABNORMAL HIGH (ref 80–100)
MONO ABS: 0.5 x10 3/mm (ref 0.2–0.9)
Monocyte %: 4.7 %
NEUTROS ABS: 9 10*3/uL — AB (ref 1.4–6.5)
Neutrophil %: 83.6 %
RBC: 2.09 10*6/uL — ABNORMAL LOW (ref 3.80–5.20)
RDW: 25.5 % — ABNORMAL HIGH (ref 11.5–14.5)
WBC: 10.8 10*3/uL (ref 3.6–11.0)

## 2014-08-14 LAB — CBC WITH DIFFERENTIAL/PLATELET
BASOS ABS: 0 10*3/uL (ref 0.0–0.1)
BASOS PCT: 0.3 %
EOS ABS: 0.1 10*3/uL (ref 0.0–0.7)
EOS PCT: 1.4 %
HCT: 19.8 % — ABNORMAL LOW (ref 35.0–47.0)
HGB: 6.9 g/dL — ABNORMAL LOW (ref 12.0–16.0)
LYMPHS ABS: 1.1 10*3/uL (ref 1.0–3.6)
Lymphocyte %: 11.5 %
MCH: 39.4 pg — AB (ref 26.0–34.0)
MCHC: 34.6 g/dL (ref 32.0–36.0)
MCV: 114 fL — ABNORMAL HIGH (ref 80–100)
Monocyte #: 0.5 x10 3/mm (ref 0.2–0.9)
Monocyte %: 5.4 %
NEUTROS PCT: 81.4 %
Neutrophil #: 7.9 10*3/uL — ABNORMAL HIGH (ref 1.4–6.5)
Platelet: 58 10*3/uL — ABNORMAL LOW (ref 150–440)
RBC: 1.74 10*6/uL — ABNORMAL LOW (ref 3.80–5.20)
RDW: 25.2 % — ABNORMAL HIGH (ref 11.5–14.5)
WBC: 9.7 10*3/uL (ref 3.6–11.0)

## 2014-08-14 LAB — PROTIME-INR
INR: 1.7
Prothrombin Time: 19.6 secs — ABNORMAL HIGH (ref 11.5–14.7)

## 2014-08-14 LAB — TROPONIN I
Troponin-I: 0.02 ng/mL
Troponin-I: 0.03 ng/mL

## 2014-08-14 LAB — TSH: Thyroid Stimulating Horm: 2.44 u[IU]/mL

## 2014-08-15 LAB — CBC WITH DIFFERENTIAL/PLATELET
BASOS ABS: 0 10*3/uL (ref 0.0–0.1)
BASOS PCT: 0.3 %
EOS ABS: 0.2 10*3/uL (ref 0.0–0.7)
EOS PCT: 2.1 %
HCT: 25.1 % — ABNORMAL LOW (ref 35.0–47.0)
HGB: 8.8 g/dL — ABNORMAL LOW (ref 12.0–16.0)
Lymphocyte #: 1.3 10*3/uL (ref 1.0–3.6)
Lymphocyte %: 15 %
MCH: 38 pg — AB (ref 26.0–34.0)
MCHC: 34.9 g/dL (ref 32.0–36.0)
MCV: 109 fL — ABNORMAL HIGH (ref 80–100)
Monocyte #: 0.4 x10 3/mm (ref 0.2–0.9)
Monocyte %: 4.7 %
NEUTROS ABS: 7 10*3/uL — AB (ref 1.4–6.5)
Neutrophil %: 77.9 %
RBC: 2.31 10*6/uL — AB (ref 3.80–5.20)
RDW: 27.1 % — AB (ref 11.5–14.5)
WBC: 9 10*3/uL (ref 3.6–11.0)

## 2014-08-15 LAB — BASIC METABOLIC PANEL
Anion Gap: 10 (ref 7–16)
BUN: 8 mg/dL (ref 7–18)
Calcium, Total: 7.4 mg/dL — ABNORMAL LOW (ref 8.5–10.1)
Chloride: 101 mmol/L (ref 98–107)
Co2: 28 mmol/L (ref 21–32)
Creatinine: 0.76 mg/dL (ref 0.60–1.30)
Glucose: 155 mg/dL — ABNORMAL HIGH (ref 65–99)
Osmolality: 279 (ref 275–301)
Potassium: 3 mmol/L — ABNORMAL LOW (ref 3.5–5.1)
SODIUM: 139 mmol/L (ref 136–145)

## 2014-08-15 LAB — TROPONIN I: Troponin-I: <0.02 ng/mL

## 2014-08-17 LAB — BODY FLUID CULTURE

## 2014-08-21 ENCOUNTER — Ambulatory Visit: Payer: Self-pay | Admitting: Internal Medicine

## 2014-09-21 DEATH — deceased

## 2015-01-12 NOTE — Discharge Summary (Signed)
PATIENT NAME:  Jenna [name], Jenna [name] MR#:  [id number] DATE OF BIRTH:  [birth year]  DATE OF ADMISSION:  07/13/2014 DATE OF DISCHARGE:  07/16/2014  DISCHARGE DIAGNOSES:  1.  Alcoholic hepatitis over alcoholic cirrhosis.  2.  Alcohol abuse.  3.  Pancytopenia.  4.  Esophageal varices.  5.  Culture negative urinary tract infection.   CODE STATUS: Limited code with no intubation, no PEG tube, okay for cardiopulmonary resuscitation if needed.   DISCHARGE MEDICATIONS:  1.  Levothyroxine 50 mcg daily.  2.  Multivitamin 1 tablet daily.  3.  Prednisolone 40 mg oral syrup daily for 30 days.  4.  Ciprofloxacin 500 mg oral 2 times a day for 3 days.  5.  Protonix 40 mg oral 2 times a day.  6.  Lactulose 15 mL oral once a day.  7.  Ferrous sulfate 325 mg oral 2 times a day.   CONSULTATIONS:  1.  Dow Adolph, MD with GI.  2.  Ned Grace, MD with palliative care.   ADMITTING HISTORY AND PHYSICAL: Please see detailed H and P dictated previously. In brief, a 64 year old female patient with history of chronic alcohol abuse with alcoholic cirrhosis, esophageal  varices and GI bleed in the past, presented to the hospital complaining of weakness and requesting detoxification from alcohol.   HOSPITAL COURSE:  1.  Alcoholic hepatitis over cirrhosis with alcohol abuse. The patient continues to drink alcohol, although her discriminate factor scoring was extremely high with one-month mortality high per GI. The patient was thought to be a liver transplant candidate but considering her alcohol abuse she was not a candidate for transplant. The patient's bilirubin has slowly trended down. Considering her high scoring and poor prognosis, the patient has been started on prednisolone by GI. The patient's bilirubin initially was at 15, presently down to 9.2 and the patient is slowly improving. The patient did have a gallstone on her ultrasound but no pain. This was not thought to be obstructing and her cause was indirect  bilirubin from her alcoholic cirrhosis.  2.  Chronic blood loss anemia over anemia of chronic disease. The patient does have esophageal varices along with nutritional deficiencies and chronic blood loss from the varices. The patient had 2 units of packed RBCs transfusion. Her hemoglobin is greater than 7. The patient does not complain of any melena. No bleeding. No EGD needed.  3.  Hypothyroidism. Continue levothyroxine.   The patient has poor prognosis with high one-month mortality considering ongoing alcohol abuse and high MELD scoring. The patient is presently on prednisolone, will follow up with [facility]. I requested palliative care see the patient. The patient has been set up with hospice at this time. Depending on the patient's abstinence from alcohol and improvement, the patient can be referred to [facility] for a liver transplant, but unfortunately I do not think the patient would survive that long with her severe liver disease and will benefit with hospice. Presently, the patient does not have any pain or anxiety.   Prior to discharge, the patient does not have any abdominal tenderness, abdomen is soft, bowel sounds are present, does have icterus. Lungs sound clear. Heart sounds S1, S2 without any murmurs.  DISCHARGE INSTRUCTIONS: Regular diet. Activity as tolerated. Follow up with hospice and [facility].   TIME SPENT ON DAY OF DISCHARGE: In discharge activity was 45 minutes.     ____________________________ Molinda Bailiff Evelean Bigler, MD srs:TT D: 07/16/2014 13:52:16 ET T: 07/16/2014 22:09:39 ET JOB#: [id number]  cc: Wardell Heath  R. Keisuke Hollabaugh, MD, <Dictator> Orie FishermanSRIKAR R Kay Ricciuti MD ELECTRONICALLY SIGNED 07/26/2014 14:55

## 2015-01-12 NOTE — Consult Note (Signed)
EGD done for GI bleeding,heme pos stool and hx of ETOH induced cirrhosis.  Grade 1 varices, non bleeding, mild portal hypertensive gastropathy, no blood in stomach.start clear liq and advance to full as tolerated. Can stop Octreotide in morning 8/16  Electronic Signatures: Scot JunElliott, Dalante Minus T (MD)  (Signed on 15-Aug-15 16:03)  Authored  Last Updated: 15-Aug-15 16:03 by Scot JunElliott, Sahar Ryback T (MD)

## 2015-01-12 NOTE — H&P (Signed)
PATIENT NAME:  Jenna [name], Jenna [name] MR#:  [id number] DATE OF BIRTH:  04/07/1951  DATE OF ADMISSION:  05/04/2014  REFERRING PHYSICIAN: Alfonse FlavorsPhilip Stafford, MD  PRIMARY CARE PHYSICIAN: Nonlocal.  ADMITTING DIAGNOSES: 1.  Gastrointestinal bleed. 2.  Cirrhosis. 3.  Diabetes. 4.  Hypothyroidism.   HISTORY OF PRESENT ILLNESS: This is [name] 64 year old Caucasian female who presents to the Emergency Department from an alcohol rehab facility after feeling dizzy and weak. The patient's last drink was yesterday and she was admitted to this facility the day before presentation to the Emergency Department. She had gotten up to walk with some of the staff when she became dizzy and felt as if she might collapse. The patient did not fall or hit her head. There was no loss of consciousness. Per the rehab facility staff, she did seem somewhat confused. At the time of our interview, her confusion had resolved.   REVIEW OF SYSTEMS: CONSTITUTIONAL: The patient denies fevers or weight loss.  EYES: The patient denies decreased visual acuity or inflammation. EARS, NOSE, AND THROAT:  The patient denies sore throat or nosebleeds.  RESPIRATORY: The patient denies cough or wheezing.  CARDIOVASCULAR: The patient denies chest pain or palpitations. GASTROINTESTINAL: The patient denies nausea, vomiting, diarrhea or abdominal pain.  GENITOURINARY: The patient denies dysuria, hesitancy, or frequency of urination. HEMATOLOGIC AND LYMPHATIC: The patient denies bruising or bleeding.  INTEGUMENT: The patient denies rashes or lesions.  MUSCULOSKELETAL: The patient denies arthralgias or myalgias.  NEUROLOGIC: The patient admits to some weakness but denies numbness.  PSYCHIATRIC: The patient denies suicidal ideation or homicidal ideation.   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   PAST SURGICAL HISTORY: Significant for  hysterectomy, unilateral fallopian tube and ovary removal, as well as an appendectomy.   FAMILY HISTORY: The patient's mother and brother are both deceased of coronary artery disease. They both had hypertension. Her father is deceased of liver cancer. Both her mother and sister had breast cancer. The patient gets yearly breast exams.   SOCIAL HISTORY: She is [name] 14 pack-year smoker. She drinks 3 vodka and orange juice beverages per day. She has been drinking for most of her life. She lives alone and she does not take any drugs.   MEDICATIONS: None.  ALLERGIES: No known drug allergies.   PERTINENT LABORATORY RESULTS AND RADIOLOGIC FINDINGS: BUN 4, serum albumin 2.5, total bilirubin 6.4, direct bilirubin 3.4 alkaline phosphatase 174, AST 64, ALT 18. Troponins are negative. Thyroid stimulating hormone is 7. Hematocrit 25.6 and MCV 128. The patient is O positive. She has sterile pyuria of her urine.   CT of the head without contrast shows no acute intracranial abnormalities, but there is some cerebral and cerebellar atrophy that is advanced for age as well as some microvascular ischemic changes in white matter.   Chest x-ray shows some mild vascular congestion, but otherwise no acute cardiopulmonary process.   PHYSICAL EXAMINATION: VITAL SIGNS: Temperature is 98.5, pulse 96, respirations 18, blood pressure 114/65, pulse oximetry 97% on room air.  GENERAL: The patient is tired but oriented x3. She is in no apparent distress.  HEENT: Normocephalic, atraumatic. PERRLA. EOMI. Moist mucous membranes. There is scleral icterus.  NECK: Trachea is midline. No adenopathy.  CHEST: Symmetric and atraumatic. There is clearly visible engorgement of chest wall veins. CARDIOVASCULAR: Regular rate and rhythm. Normal S1, S2. [name] 2/6 systolic ejection murmur heard best over aortic valve area without any rubs  or clicks.  LUNGS: Clear to auscultation bilaterally. Normal effort and excursion.  ABDOMEN: Positive bowel  sounds. Soft and nontender. There is [name] fluid wave and the abdomen is distended with fluid. There is no hepatosplenomegaly. The liver edge is firm and directly under the rib. There is no caput medusa. GENITOURINARY: Deferred.  MUSCULOSKELETAL: The patient moves all 4 extremities equally.  SKIN: Jaundice, but no rashes or lesions. There are some spider angiomas on her face. EXTREMITIES: No clubbing, cyanosis, or edema.  NEUROLOGIC: Cranial nerves II through XII are grossly intact. There is no asterixis.  ASSESSMENT AND PLAN: This is [name] 64 year old female admitted for [name] gastrointestinal bleed. It is apparent that her hemoglobin has dropped somewhat and the patient was Hemoccult positive in the Emergency Department.  1.  Gastrointestinal bleed. Will check orthostatic blood pressures. Her hemoglobin is not in dangerously low range at this time. It has dropped approximately 1 gram since her last hospital admission. The patient denies seeing any bright red blood per rectum lately; however, she sometimes has hemorrhoids and will see some blood on toilet tissue. Her INR is 1.9.  2.  Cirrhosis. The patient has jaundice and ascites. She has clearly lost some synthetic function as her INR is slightly increased without any anticoagulation. There is no evidence of spontaneous bacterial peritonitis. Her last drink was yesterday. We might consider ordering at least 1 alcoholic beverage for her while she is in the hospital to prevent delirium tremens. We will initiate [name] CIWA scale to monitor for alcohol withdrawal.  3.  Diabetes mellitus. The patient will be on sliding scale insulin while in the hospital.  4.  Hypothyroidism. We will place the patient on Synthroid to help stabilize her thyroid function.  5.  Transaminitis secondary to alcohol abuse.  6.  Malnutrition likely secondary to potomania.  7.  Anemia, megaloblastic. Likely secondary to folate deficiency. We should start the patient on [name] multivitamin.  8.  Deep  vein thrombosis prophylaxis. SCDs.  9.  Gastrointestinal prophylaxis. Pantoprazole.  CODE STATUS: The patient is [name] FULL code.   TIME SPENT ON ADMISSION ORDERS AND PATIENT CARE: Approximately 45 minutes.  ____________________________ Kelton Pillar. Sheryle Hail, MD msd:sb D: 05/04/2014 09:55:42 ET T: 05/04/2014 10:45:39 ET JOB#: [id number]  cc: Kelton Pillar. Sheryle Hail, MD, <Dictator> Kelton Pillar Markie Frith MD ELECTRONICALLY SIGNED 05/06/2014 0:29

## 2015-01-12 NOTE — H&P (Signed)
PATIENT NAME:  Jenna [name], Mahkayla A MR#:  [id number] DATE OF BIRTH:  10/27/1950  DATE OF ADMISSION:  07/13/2014  PRIMARY CARE PHYSICIAN:  Nonlocal.   REFERRING PHYSICIAN:  Darien Ramusavid W Kaminski, MD   CHIEF COMPLAINT:  Detox and increased jaundice for about 10 days.    HISTORY OF PRESENT ILLNESS:  A 64 year old Caucasian female with a history of diabetes, alcohol abuse, liver cirrhosis presented to the ED with above chief complaint. Patient is alert, awake, oriented in no acute distress. Patient said she came here for detox. In addition patient has complaints poor oral intake and then generalized weakness. Patient also has increased jaundiced for the past one half weeks.  Patient said she lost weight of 40 pounds for the past 1 year. Actually, patient was just discharged from our hospital on August due to GI bleeding.   Patient denies any abdominal pain, nausea, vomiting or diarrhea.  Denies any melena, bloody stool; patient was noticed to have a high bilirubin at 15, Dr. Carollee MassedKaminski admitted patient for hyperbilirubinemia and detox.    PAST MEDICAL HISTORY: Alcohol abuse, alcoholic, hepatitis, cirrhosis due to alcohol abuse, pancytopenia, hypothyroidism, grade I esophageal varices, portal hypertensive gastropathy, anemia due to GI bleeding.   SOCIAL HISTORY: Smokes 1 pack a day for more than 20 years, still drinking alcohol on a daily basis, denies any drug abuse.   SURGICAL HISTORY: Hysterectomy, unilateral fallopian tube and ovary removal, appendectomy.  FAMILY HISTORY: Mother and brother are both deceased of CAD, both had hypertension; father has liver cancer, both mother and sister have breast cancer.  Patient is living alone.  ALLERGIES: NONE.   HOME MEDICATIONS: Medication reconciliation (Dictation Anomaly), not done yet, so far pantoprazole 40 mg once a day, multivitamin once a day, levothyroxine 50 mcg p.o. daily, prednisolone 15 mg per 5 mL 13.33 mL once a day for 20 days, then 5 mL for 10 days,  then stop.    REVIEW OF SYSTEMS:   CONSTITUTIONAL: Patient denies any fever, chills, no headache or dizziness but has generalized weakness and poor oral intake.  EYES:  No double vision (Dictation Anomaly) patient but has scleral icterus. CARDIOVASCULAR:  No chest pain, palpitations, orthopnea, nocturnal dyspnea, no leg edema. PULMONARY:  No cough, sputum, shortness of breath or hemoptysis.  GASTROINTESTINAL:  No abdominal pain, nausea, vomiting or diarrhea, no melena or bloody stool. GENITOURINARY:  No dysuria, hematuria, or incontinence.  SKIN: No rash but has jaundice.  HEMATOLOGIC:  No easy bruising, bleeding. ENDOCRINE:  No polyuria, polydipsia, heat or cold intolerance.  NEUROLOGIC: No syncope, loss of consciousness or seizure.  VITAL SIGNS: Temperature 99.8, blood pressure 139/75, pulse 114, O2 saturation 99% on room air.   PHYSICAL EXAMINATION:  GENERAL: This patient is alert, awake, oriented in no acute distress.  HEENT: Pupils round, equal and reactive to light and accommodation, there is scleral icterus, moist oral mucosa, clear pharynx. NECK: Supple, no JVD or carotid bruits, no lymphadenopathy (Dictation Anomaly), no thyromegaly.  CARDIOVASCULAR:  S1, S2, regular rate and rhythm, no murmurs, gallops.  PULMONARY:  Bilateral air entry, no wheezing or rales, no use of accessory muscle to breathe.  ABDOMEN: Obese, no distention, no tenderness, no obvious ascites or organomegaly, bowel sounds present.  EXTREMITIES: No edema, clubbing or cyanosis, no calf tenderness, bilateral pedal pulses present.  SKIN: Jaundice but no rash or bruises.   NEUROLOGIC: AO x 3, and no focal deficit, power 4/5, sensation intact.   DIAGNOSTIC DATA:   1.  Abdominal ultrasound showed  nonmobile echogenic 8 (Dictation Anomaly) focus gallbladder neck, this could represent shadowing stone, tumor, (Dictation Anomaly) sludge or polyp, fatty infiltration of liver or hepatocellular disease, mild ascites.  2.   WBC 5.1, hemoglobin 7.4, platelet 52,000, lipase 134, glucose 133, BUN 80, creatinine 0.48, sodium 135, potassium 3.8, chloride 101, SGPT 25,  SGOT 76, albumin 2.7, total bilirubin 15.3, (Dictation Anomaly) is 120, (Dictation Anomaly) less than 3, TSH 2.62.  IMPRESSIONS:  1.  Hyperbilirubinemia. 2.  Possible gallbladder stone or sludge.  3.  Liver cirrhosis. 4.  Alcohol abuse.  5.  Anemia. 6.  Thrombocytopenia. 7.  Hypothyroidism. 8.  Grade I esophageal varices.   9.  Portal hypertensive gastropathy.   10.  Tobacco abuse.   PLAN OF TREATMENT:   1.  The patient will be admitted to medical floor for hyperbilirubinemia and possible gallbladder stone. We will request GI consult from Dr. Markham Jordan for further workup.   2.  For alcohol abuse we will start CIWA protocol and also follow up CMP and bilirubin level.  3.  For anemia and thrombocytopenia which is probably due to liver cirrhosis. Continue to monitor CBC.  4.  For tobacco abuse, smoking cessation was counselled; we will give a nicotine patch.  The patient was counselled for 4 minutes.    5.  Continue patients home medications.   6.  Discussed the patient condition and plan of treatment with patient and the patients daughter, patient wanted full code.    TIME SPENT: About 57 minutes.     ____________________________ Raechel Chute Dasovich, PA nad:nt D: 07/13/2014 17:58:57 ET T: 07/13/2014 18:57:28 ET JOB#: [id number]  cc: Nicholes A. Dasovich, PA, <Dictator>

## 2015-01-12 NOTE — Consult Note (Signed)
PATIENT NAME:  Jenna [name], Jenna [name] MR#:  [id number] DATE OF BIRTH:  05-Oct-1950  DATE OF CONSULTATION:  05/04/2014  REFERRING PHYSICIAN:  Dr. Sheryle Hail CONSULTING PHYSICIAN: Lynnae Prude, MD / Ranae Plumber. Arvilla Market, ANP (Adult Nurse Practitioner)  PRIMARY CARE PHYSICIAN: Alba Cory, MD (She has not seen since November 2014)  REASON FOR CONSULTATION: Gastrointestinal bleed.   HISTORY OF PRESENT ILLNESS: This 64 year old patient with history of alcohol abuse states she is unaware of a history of cirrhosis. She denies prior hospital admission for her liver disease. She did present to the Emergency Room requesting assistance with alcohol rehab and was referred to [facility]. The patient apparently drank alcohol before going to [facility]. She was admitted to [facility], which is an outpatient rehab facility, and started on medication to prevent withdrawals. Her family reports she had a little bit of problems speaking, no depth perception last night, and acute dizziness and weakness with walking. It is their belief the patient was overmedicated. Because of confusion she was transported back to the hospital.   In the ER, she was found to have a drop in her hemoglobin from 9.5 on 06/30/2014 down to 7.8. She had heme-positive stool. She has denied melena, hematemesis, but occasionally sees bright red blood on the toilet tissue. Her admitting INR was 1.9 with pro time 21.2. She was found to have transaminitis thought secondary to alcohol abuse and has a Maddrey discrimination factor of 49 and was  therefore was started on oral prednisolone. She was also given a dose of vitamin K. Her platelet count on admission was 67 to 73.   The patient has history of hypothyroidism, diabetes mellitus, depression, and has been off of all of her usual medications since January 2015 when she lost her insurance because she was no longer employed. The patient reports she has not seen her primary care provider since about November 2014. She says she has  been advised to stop drinking through the years, but she has never been told she has liver disease or cirrhosis.   Since she has been hospitalized, she denies passage of stool, no abdominal pain. She will occasionally get nonspecific intermittent abdominal discomfort. She has had 40 pound weight loss since November due to no appetite. She also reports it is probably due to coming off of her diabetes medication. The patient says she feels somewhat weak, but improved and she is very hungry. She denies nausea, vomiting, heartburn, dysphagia. She denies history of EGD or colonoscopy. She says she has only been hospitalized for her surgeries and childbirth. She has been hospitalized at [facility] and once at [facility].   PAST MEDICAL HISTORY: 1.  Alcohol abuse.  2.  Diabetes mellitus, type II.  3.  Hypothyroidism.  4.  Depression, possible bipolar.  5.  Nephrolithiasis.   PAST SURGICAL HISTORY: 1.  Hysterectomy.  2.  Unilateral fallopian tube and ovary removal.  3.  Appendectomy.  4.  Lithotripsy for nephrolithiasis.   MEDICATIONS ON ADMISSION: No medications since January when she lost her job and insurance. Previous to that she reports she was on Prozac, BuSpar, Zyprexa, Levothroid, possibly metformin and another injectable type diabetic medication, which she does not know the name.   ALLERGIES: No known drug allergies.   FAMILY HISTORY: Father with history of cirrhosis or cancer of the liver with alcoholism. Brother with history of alcoholism and he was deceased with myocardial infarction.   REVIEW OF SYSTEMS: A 12 point review obtained and positives as noted in the history of present  illness. She reports poor appetite, 40 pound weight loss since November. She denies pruritus, jaundice, edema, protuberant abdomen or history of ascites. She has reported mild constipation, bright red blood per rectum occasionally. No melena. No frank bleeding. She denies history of EGD or colonoscopy. She has felt very  unmotivated with depression more pronounced.   HABITS: Positive tobacco, 14 pack-year smoker. She admits to 3 vodka and orange juice per day. She reports drinking most of her life. The patient is divorced, lives alone. She does have adult children and grandchildren present. 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 She states she started to feel too weak and sick in that time frame and had to quit her job.   DIAGNOSTIC DATA: Pertinent laboratory: Laboratory studies from chart review dated October 2014:  hemoglobin 12.8, platelet count 70,000, albumin 3, total bilirubin 2, alkaline phosphatase 205, AST 109, and ALT 33.   Laboratory studies dated 04/30/2014: total bilirubin 5, alkaline phosphatase 171, hemoglobin 9.5, platelet count 67,000. Hemoglobin drift to 7.8. TSH 7. Alcohol level 0.272 on admission. Ammonia level 12. Total bilirubin increased to 10.6, now 6.4. Direct bilirubin 3.4. Alkaline phosphatase 174, AST 64, ALT 18, albumin 2.5.   Radiology: Chest x-ray, PA and lateral, shows mild vascular congestion but lungs remain clear.   CT of the head without contrast showed no acute intracranial abnormality.   PHYSICAL EXAMINATION: VITAL SIGNS: 98.6, 93, respirations 18, 96/58, pulse ox on room air is 91%. This is down from 97% on admission.  GENERAL: Elderly, obese Caucasian female who looks somewhat disheveled, resting in bed. She is visiting with her family. She is cooperative and very pleasant.  HEENT: Head is normocephalic. Conjunctivae pink. Positive scleral icterus noted.  NECK: Supple. Trachea is midline.  HEART: Heart tones S1, S2. Positive systolic ejection murmur noted. Regular rate.  LUNGS: CTA, positive to few bibasilar crackles noted.  ABDOMEN: Soft, protuberant, nontender. Positive fluid wave and ascites. Slight protuberance of the umbilicus. No abnormal blood vessels noted in the abdomen. The liver is slightly palpable, there may be nodularity present. No  tenderness.  SKIN: Warm and dry. Positive jaundice. Spider angiomas noted about her face only.  EXTREMITIES: Without edema, positive palmar erythema noted.  NEUROLOGIC: She is speaking, clearly, slight drowsiness noted but she is alert, oriented and consistent historian. The patient is following commands and is cooperative and pleasant. Cranial nerves are grossly intact. There is asterixis on the right hand slightly.  RECTAL: Small, hard light brown stool noted.   IMPRESSION AND PLAN:  1.  Acute anemia, suspect upper gastrointestinal bleed. Etiology to rule out gastric or esophageal varices. She could have erosive gastritis, gastric ulcer, peptic ulcer disease as well. She is getting serial hemoglobin and orders are to transfuse if her hemoglobin drops below 7. Would recommend EGD this afternoon for definitive evaluation. She is n.p.o. The patient is on octreotide drip and IV Protonix and would continue those at this time.  2.  Alcohol abuse, high risk for withdrawal. The patient is requesting assistance. She is on CIWA protocol.  3.  Alcoholic hepatitis with marked elevation in liver enzymes, bilirubin, and she does have a Maddrey score of 49 and therefore was started on oral prednisolone. The patient says she is unaware of the  diagnosis of cirrhosis.  4.  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 She has received vitamin K oral. She has thrombocytopenia, elevated LFTs as noted and microcytic anemia. Would recommend thiamine and vitamin B12 supplementation.  She will need liver imaging studies and further specialty liver lab work up when clinically feasbile.  5.  Hypothyroidism. Off her chronic levothyroxine since about January. Hypothyroidism  can contribute to the elevated liver enzymes as well. She has been restarted her on medication.  6.  She has a history of depression and she reports she has been off of her usual psychiatric medications, Prozac, BuSpar and Zyprexa since January. Recommend  psychiatric evaluation and management.  7.  Diabetes mellitus. She reports she has been off of her usual medications and will need to monitor. Would consider hemoglobin A1c.  8. Constipation/heme-positive stool. Pending EGD results, consider Dulcolax suppository and eventual colonoscopy.   Further gastroenterology recommendations pending findings. This case was discussed with Dr. Mechele CollinElliott in collaboration of care.   This services provided by Cala BradfordKimberly A. Arvilla MarketMills, MS, APRN, BC, ANP under collaborative agreement with Dr. Lynnae Prudeobert Elliott.  ____________________________ Ranae PlumberKimberly A. Arvilla MarketMills, ANP (Adult Nurse Practitioner) kam:sb D: 05/04/2014 15:24:12 ET T: 05/04/2014 16:33:42 ET JOB#: [id number]  cc: Cala BradfordKimberly A. Arvilla MarketMills, ANP (Adult Nurse Practitioner), <Dictator> Ranae PlumberKimberly A. Suzette BattiestMills RN, MSN, ANP-BC Adult Nurse Practitioner ELECTRONICALLY SIGNED 05/04/2014 19:14

## 2015-01-12 NOTE — H&P (Signed)
PATIENT NAME:  Jenna [name], Jenna [name] MR#:  [id number] DATE OF BIRTH:  1951-05-30  DATE OF ADMISSION:  07/13/2014  PRIMARY CARE PHYSICIAN:  Nonlocal.   REFERRING PHYSICIAN:  Darien Ramus, MD   CHIEF COMPLAINT:  Detox and increased jaundice for about 10 days.    HISTORY OF PRESENT ILLNESS:  A 64 year old Caucasian female with a history of diabetes, alcohol abuse, liver cirrhosis presented to the ED with above chief complaint. Patient is alert, awake, oriented in no acute distress. Patient said she came here for detox. In addition patient has complaints poor oral intake and then generalized weakness. Patient also has increased jaundiced for the past one half weeks.  Patient said she lost weight of 40 pounds for the past 1 year. Actually, patient was just discharged from our hospital on August due to GI bleeding.   Patient denies any abdominal pain, nausea, vomiting or diarrhea.  Denies any melena, bloody stool; patient was noticed to have a high bilirubin at 15, Dr. Carollee Massed admitted patient for hyperbilirubinemia and detox.    PAST MEDICAL HISTORY: Alcohol abuse, alcoholic, hepatitis, cirrhosis due to alcohol abuse, pancytopenia, hypothyroidism, grade I esophageal varices, portal hypertensive gastropathy, anemia due to GI bleeding.   SOCIAL HISTORY: Smokes 1 pack a day for more than 20 years, still drinking alcohol on a daily basis, denies any drug abuse.   SURGICAL HISTORY: Hysterectomy, unilateral fallopian tube and ovary removal, appendectomy.  FAMILY HISTORY: Mother and brother are both deceased of CAD, both had hypertension; father has liver cancer, both mother and sister have breast cancer.  Patient is living alone.  ALLERGIES:  No known diagnosed allergies.    HOME MEDICATIONS: Medication reconciliation list, not done yet, so far pantoprazole 40 mg once a day, multivitamin once a day, levothyroxine 50 mcg p.o. daily, prednisolone 15 mg per 5 mL 13.33 mL once a day for 20 days, then 5 mL for  10 days, then stop.    REVIEW OF SYSTEMS:   CONSTITUTIONAL: Patient denies any fever, chills, no headache or dizziness but has generalized weakness and poor oral intake.  EYES:  No double vision or blurry but has scleral icterus. CARDIOVASCULAR:  No chest pain, palpitations, orthopnea, nocturnal dyspnea, no leg edema. PULMONARY:  No cough, sputum, shortness of breath or hemoptysis.  GASTROINTESTINAL:  No abdominal pain, nausea, vomiting or diarrhea, no melena or bloody stool. GENITOURINARY:  No dysuria, hematuria, or incontinence.  SKIN: No rash but has jaundice.  HEMATOLOGIC:  No easy bruising, bleeding. ENDOCRINE:  No polyuria, polydipsia, heat or cold intolerance.  NEUROLOGIC: No syncope, loss of consciousness or seizure.  VITAL SIGNS: Temperature 99.8, blood pressure 139/75, pulse 114, O2 saturation 99% on room air.   PHYSICAL EXAMINATION:  GENERAL: This patient is alert, awake, oriented in no acute distress.  HEENT: Pupils round, equal and reactive to light and accommodation, there is scleral icterus, moist oral mucosa, clear pharynx. NECK: Supple, no JVD or carotid bruits, no lymphadenopathy or thyromegaly.  CARDIOVASCULAR:  S1, S2, regular rate and rhythm, no murmurs, gallops.  PULMONARY:  Bilateral air entry, no wheezing or rales, no use of accessory muscle to breathe.  ABDOMEN: Obese, no distention, no tenderness, no obvious ascites or organomegaly, bowel sounds present.  EXTREMITIES: No edema, clubbing or cyanosis, no calf tenderness, bilateral pedal pulses present.  SKIN: Jaundice but no rash or bruises.   NEUROLOGIC: AO x 3, and no focal deficit, power 4/5, sensation intact.   DIAGNOSTIC DATA:   1. Abdominal ultrasound showed  nonmobile echogenic 8 mm focus gallbladder neck. This could represent non shadowing stone, tumefactive sludge, or polyp. Echogenic liver suggesting fatty infiltration or hepatocellular disease. Mild ascites.  2. WBC 5.0, hemoglobin 7.4, platelet count  52,000. Lipase 134. Glucose 133, BUN 8, creatinine 0.48, sodium 135, potassium 3.8, chloride 101, SGPT 25,  SGOT 76, albumin 2.7, total bilirubin 15.3, alkaline phosphatase is 120. Ethanol S less than 3. TSH 2.62.  IMPRESSIONS:  1. Hyperbilirubinemia. 2. Possible gallbladder stone or sludge.  3. Liver cirrhosis. 4. Alcohol abuse.  5. Anemia. 6. Thrombocytopenia. 7. Hypothyroidism. 8. Grade I esophageal varices.   9. Portal hypertensive gastropathy.   10. Tobacco abuse.   PLAN OF TREATMENT:   1. The patient will be admitted to medical floor for hyperbilirubinemia and possible gallbladder stone. We will request GI consult from Dr. Markham JordanElliot for further workup.   2. For alcohol abuse we will start CIWA protocol and also follow up CMP and bilirubin level.  3. For anemia and thrombocytopenia which is probably due to liver cirrhosis. Continue to monitor CBC.  4. For tobacco abuse, smoking cessation was counselled; we will give a nicotine patch.  The patient was counselled for 4 minutes.    5. Continue patient's home medications.   6. Discussed the patient condition and plan of treatment with patient and the patient's daughter, patient wanted FULL CODE.    TIME SPENT: About 57 minutes.    ____________________________ Shaune PollackQing Jocelynn Gioffre, MD qc:nt D: 07/13/2014 17:58:00 ET T: 07/13/2014 18:57:28 ET JOB#: [id number] Shaune PollackQING Stepfon Rawles MD ELECTRONICALLY SIGNED 07/17/2014 13:21

## 2015-01-12 NOTE — Consult Note (Signed)
   Comments   I spoke with pt about discharge plan. She agrees that she cannot return home and that her daughter cannot care for her in her home. I offered the option of the Hospice Home and pt now agrees. I also discussed code status again in light of decision for Hospice Home and pt agrees with DNR. Orders entered.   Electronic Signatures: Sarit Sparano, Harriett SineNancy (MD)  (Signed [phone]-Nov-15 15:21)  Authored: Palliative Care   Last Updated: 24-Nov-15 15:21 by Nyomi Howser, Harriett SineNancy (MD)

## 2015-01-12 NOTE — Consult Note (Signed)
Brief Consult Note: Diagnosis: Acute anemia, heme positive brown stool, ETOH cirrhosis.   Patient was seen by consultant.   Consult note dictated.   Comments: Decision per Dr. Mechele CollinElliott to hold off on EGD today since she is not actively bleeding. O2 sats dropping slightly. I spoke to Dr. Renae GlossWieting. He has decreased her IV fluids. Continue monitoring clinical course and Dr. Mechele CollinElliott to determine timing of EGD.  Electronic Signatures: Rowan BlaseMills, Prakriti Carignan Ann (NP)  (Signed 14-Aug-15 16:09)  Authored: Brief Consult Note   Last Updated: 14-Aug-15 16:09 by Rowan BlaseMills, Silvino Selman Ann (NP)

## 2015-01-12 NOTE — Discharge Summary (Signed)
PATIENT NAME:  NARE, GASPARI MR#:  [id number] DATE OF BIRTH:  [birth year]  DATE OF ADMISSION:  08/13/2014 DATE OF DISCHARGE:  08/15/2014  ADMITTING DIAGNOSIS: Ascites due to alcoholic cirrhosis.   DISCHARGE DIAGNOSES:  1. Spontaneous bacterial peritonitis.  2. Ascites due to cirrhosis.  3. Acute blood loss anemia on chronic anemia after paracentesis.  4. New onset atrial fibrillation with rapid ventricular rate, now controlled.  5. Cirrhosis due to alcohol abuse.  6. Diabetes mellitus, type 2.  7. Ongoing alcohol abuse  8. Esophageal varices due to cirrhosis.  9. Pancytopenia due to cirrhosis.  10. Hypothyroidism.   CONSULTATIONS: Dr. Harriett Sine Phifer, palliative care.   PROCEDURES:  1. Ultrasound-guided paracentesis 08/13/2014 with removal of about 2750 mL of fluid.  2. Chest x-ray, November 22, shows mild congestive heart failure.   HISTORY OF PRESENT ILLNESS: This 64 year old female with past medical history of advanced liver disease presents to the Emergency Room complaining of pain in the abdomen and shortness of breath. Pain has been worsening in severity since being discharged from [facility] 2 days prior to admission. That admission was for alcohol detoxification, but she did have ascites at that time.   HOSPITAL COURSE BY PROBLEM:  1. Spontaneous bacterial peritonitis: Ascitic fluid with greater than 800 white blood cells. Culture negative to date at 36 hours. She was treated inpatient with cefotaxime q.8 hours. She is discharged on Levaquin to complete a 10 day course.  2. Acute blood loss anemia due to bleeding after paracentesis: She is now status post 3 units of FFP to stop the bleeding. Hemoglobin dropped from 9.4 to 6.9. She then received 2 units of packed red blood cells with appropriate rise in hemoglobin to 8.8.  3. New onset atrial fibrillation with RVR: Likely triggered by anemia and other critical illnesses. She slipped and atrial fibrillation on November 24  with a rate of 170. She responded well to IV Cardizem and then was started on p.o. Cardizem with controlled rate. Troponins were cycled and were negative. Blood pressure remains low normal. At time of discharge, she is in normal sinus rhythm.  4. Cirrhosis due to alcohol: Her liver failure is advanced and with many sequelae including ascites, pancytopenia, esophageal varices. She understands that her prognosis is poor. She has had multiple hospitalizations over the past few months due to conditions related to advanced liver failure. She has decided to enter hospice and is being discharged to hospice home.  5. Diabetes mellitus, type 2: Blood sugars were controlled with sliding scale insulin.  6. Alcohol abuse: The patient is about 2 weeks without alcohol. She shows no signs of withdrawal at this time.   CODE STATUS: DNR status.   DISCHARGE PHYSICAL EXAMINATION:  VITAL SIGNS: Temperature 98.7, heart rate 105, respirations 20, blood pressure 116/68, oxygenation 93% on room air.  GENERAL: No acute distress. The patient appears critically ill.  CARDIOVASCULAR: Regular rhythm, tachycardic, II/III systolic ejection murmur, trace dependent edema. Peripheral pulses are 2+.  RESPIRATORY: Clear to auscultation bilaterally with good air movement, no respiratory distress.  ABDOMEN: Very distended with fluid wave, nontender, bowel sounds are decreased.  PSYCHIATRIC: The patient is alert and oriented with good insight into her clinical condition. She does appear depressed.   LABORATORY DATA: Sodium 139, potassium 3.0 (this was repleted before discharge), chloride 101, bicarbonate 28, BUN 8, creatinine 0.76. Blood sugar 155. Troponins negative x 3. TSH is 2.4. White blood cells 9.0, hemoglobin 8.8, platelets 58,000, MCV 109.   CONDITION  ON DISCHARGE: Stable. Terminal prognosis. The patient being discharged to hospice home.   DISPOSITION: Discharged to hospice home.   DISCHARGE MEDICATIONS:  1. Prednisolone 15  mg/5 mL oral syrup 13.33 mL once a day for 30 days.  2. Lactulose 10 g/15 mL oral syrup 15 mL orally once a day.  3. Lorazepam 0.5 mg 1-2 tablets orally or sublingual every 2-4 hours as needed for agitation.  4. Spironolactone 100 mg 1 tablet orally every day.  5. Furosemide 40 mg 1 tablet once a day.  6. Morphine 20 mg/mL oral concentrate 0.25 mL orally every 1-2 hours as needed for pain.  7. Zofran ODT 4 mg 1 tablet every 6 hours as needed for nausea.  8. Levaquin for 500 mg oral tablet 1 tablet every 24 hours for the next 8 days and then stop.   DISCHARGE INSTRUCTIONS:  DIET: Sodium restricted.  ACTIVITY: As tolerated.  FOLLOWUP: The patient will be followed at hospice home by hospice M.D.   TIME SPENT ON DISCHARGE: 40 minutes.     ____________________________ Ena Dawleyatherine P. Clent RidgesWalsh, MD cpw:bm D: 08/15/2014 15:06:26 ET T: 08/15/2014 23:25:30 ET JOB#: [id number]  cc: Santina Evansatherine P. Clent RidgesWalsh, MD, <Dictator> Gale JourneyATHERINE P WALSH MD ELECTRONICALLY SIGNED 08/25/2014 11:50

## 2015-01-12 NOTE — Consult Note (Signed)
PATIENT NAME:  Jenna [name], Jenna [name] MR#:  [id number] DATE OF BIRTH:  1951-01-28  DATE OF CONSULTATION:  07/14/2014  REFERRING PHYSICIAN:     Srikar R. Sudini, MD CONSULTING PHYSICIAN:  Dow AdolphMatthew Zo Loudon, MD  REASON FOR CONSULTATION: Alcohol cirrhosis, increasing bilirubin.   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 She also reports wanting to get detox for her ongoing alcohol use.   In the hospital it was noticed that her total bilirubin had to have increased compared to prior. GI was consulted for further management of her increasing bilirubin and alcohol cirrhosis.   Jenna [name] denies any other issues such as abdominal pain, black stools, blood from her bottom, or confusion-like episodes.   PAST MEDICAL HISTORY:  1.  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  2.  Alcohol abuse.   SOCIAL HISTORY: She is an ongoing smoker and an ongoing drinker.   PAST SURGICAL HISTORY:  1.  Hysterectomy.  2.  Appendectomy.   FAMILY HISTORY: No family history of GI malignancy.   ALLERGIES: NKDA.   MEDICATIONS AT HOME: Protonix 40 mg daily, multivitamin daily, levothyroxine 50 mcg daily, prednisolone taper.   REVIEW OF SYSTEMS:   CONSTITUTIONAL: No weight gain or weight loss.  No fever or chills. HEENT: No oral lesions or sore throat. No vision changes. GASTROINTESTINAL: See HPI.  HEME/LYMPH: No easy bruising or bleeding. CARDIOVASCULAR: No chest pain or dyspnea on exertion. GENITOURINARY: No hematuria. INTEGUMENTARY: No rashes or pruritus PSYCHIATRIC: No depression/anxiety.  ENDOCRINE: No heat/cold intolerance, no hair loss or skin changes. ALLERGIC/IMMUNOLOGIC: Negative for hives. RESPIRATORY: No cough, no shortness of breath.  MUSCULOSKELETAL: No joint swelling or muscle pain.  PHYSICAL EXAMINATION:   VITAL SIGNS: Her temperature is 98.6. She did have [name] temperature of 100.3. Her pulse is 93, respirations are 20, blood pressure is 108/63, pulse oximetry is 96% on room air.  GENERAL: Alert and oriented times 4.  No acute distress. Appears stated age. Jaundice appearing, chronically ill appearing.  HEENT: Normocephalic/atraumatic. Extraocular movements are intact. Eyes positive for scleral icterus.  NECK: Soft, supple. JVP appears normal. No adenopathy. CHEST: Clear to auscultation. No wheeze or crackle. Respirations unlabored. HEART: Regular. No murmur, rub, or gallop. Normal S1 and S2. ABDOMEN: Soft. Mild distention and mild diffuse tenderness to palpation. Normal active bowel sounds in all four quadrants.  No organomegaly. No masses EXTREMITIES: No swelling, well perfused. SKIN: No rash or lesion. Skin color, texture, turgor normal. NEUROLOGICAL: Grossly intact. PSYCHIATRIC: Normal tone and affect. MUSCULOSKELETAL: No joint swelling or erythema.    LABORATORY DATA: Her sodium is 139, creatinine 0.43, BUN is 7, potassium 3.6. Lipase is normal. Her total protein is 5.3, albumin is 2.3, total bilirubin is 15, which is down from 12. Her AST and ALT are 57 and 20. Her white count is 4.6, hemoglobin 6.0, hematocrit is 18, which is down from 22, platelets are 45,000.   IMAGING: She had an ultrasound of her abdomen which showed [name] stone in the gallbladder neck and also an echogenic liver with some mild ascites.   ASSESSMENT AND PLAN: Alcohol cirrhosis. 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 Sadly and unfortunately, she continues to drink although she did come in for detoxication. Her bilirubin has increased some and this is likely due to alcohol hepatitis. Her hemoglobin  has decreased and we will need to continue to follow this. I did tell her that if she does not stop drinking she will likely be dead  in the next few months.   RECOMMENDATIONS:  1.  She must stop alcohol if she wants to live much longer.  2.  We will check an INR and we will calculate [name] Maddrey discriminant function, may need to restart [name] course of prednisolone for alcohol hepatitis.  3.  Agree with receiving blood. At this point, she does not have any overt GI bleeding, so we will monitor her hemoglobin.  4.  Low-salt diet.  5.  If she were to develop suggestion of SBP such as abdominal pain or fevers, we would need to see if radiology would be able to perform [name] paracentesis, although if she has [name] small amount of ascites it might not be possible.  6.  If any evidence of active bleeding or hemoglobin continues to fall, we will likely perform another upper endoscopy to evaluate for [name] source of blood loss.  7.  We will follow.   Thank you for this consult.    ____________________________ Dow Adolph, MD mr:ts D: 07/14/2014 18:06:55 ET T: 07/15/2014 03:33:09 ET JOB#: [id number]  cc: Dow Adolph, MD, <Dictator> Kathalene Frames MD ELECTRONICALLY SIGNED 08/14/2014 15:28

## 2015-01-12 NOTE — H&P (Signed)
PATIENT NAME:  Jenna [name], Jenna [name] MR#:  [id number] DATE OF BIRTH:  1951-09-13  DATE OF ADMISSION:  08/13/2014  REFERRING PHYSICIAN:  Eartha Inchory R. York CeriseForbach, MD   PRIMARY CARE DOCTOR:  Nonlocal.   ADMISSION DIAGNOSIS:  Ascites due to alcoholic cirrhosis.   HISTORY OF PRESENT ILLNESS:  This is [name] 64 year old Caucasian female who presents to the Emergency Department complaining of pain in her abdomen and shortness of breath. The pain is across the lower quadrants of her abdomen bilaterally. It is nonfocal. It has gradually worsened in severity since [facility]. That admission was for alcohol detoxification. She was found to have some ascites at that time as well, but paracentesis yielded only 30 mL of fluid. We do not have the cytology or culture results on that fluid at this time. Her abdomen has increased in size since that time. She has stopped drinking alcohol. Her last drink was 11 days ago. Due to intractable pain and shortness of breath, the Emergency Department called for admission.   REVIEW OF SYSTEMS: CONSTITUTIONAL:  The patient denies fever or weakness.  EYES:  Denies blurred vision or inflammation.  EARS, NOSE, AND THROAT:  Denies tinnitus or difficulty swallowing.  RESPIRATORY:  Denies cough, but admits to shortness of breath.  CARDIOVASCULAR:  Denies chest pain or palpitations.  GASTROINTESTINAL:  Denies nausea, vomiting, or diarrhea.  GENITOURINARY:  Denies dysuria, increased frequency, or hesitancy of urination.  ENDOCRINE:  Denies polyuria or polydipsia.  HEMATOLOGIC AND LYMPHATIC:  Admits to easy bruising and bleeding.  MUSCULOSKELETAL:  Denies myalgias or arthralgias.  INTEGUMENT:  Denies rashes or lesions.  NEUROLOGIC:  Denies numbness in her extremities or dysarthria.  PSYCHIATRIC:  Denies depression or suicidal ideation.   PAST MEDICAL HISTORY:  Alcohol abuse, cirrhosis, diabetes type 2.   PAST SURGICAL HISTORY:  Appendectomy  and hysterectomy.   FAMILY HISTORY:  Heart disease.   SOCIAL HISTORY:  The patient quit smoking 11 days ago, when she quit drinking. She is currently wearing [name] nicotine patch. She denies any drug use and lives with her daughter's family.   MEDICATIONS: 1.  Ferrous sulfate 325 mg 1 tab p.o. b.i.d. with meals.  2.  Flagyl 500 mg 1 tablet p.o. q. 8 hours.  3.  Lactulose 10 grams per 15 mL 50 mL p.o. daily.  4.  Levothyroxine 50 mcg 1 tablet p.o. daily.  5.  Lorazepam 0.5 mg 1 to 2 tablets p.o. as needed for anxiety or nervousness.  6.  Magnesium oxide 400 mg 1 tablet p.o. daily.  7.  Olanzapine 2.5 mg 1 tab p.o. at bedtime.  8.  Omeprazole 40 mg delayed-release capsule 1 tablet p.o. b.i.d.  9.  Prednisolone 15 mg per 5 mL 13.3 mL p.o. once daily for 30 days.   ALLERGIES:  No known drug allergies.   PERTINENT LABORATORY RESULTS AND RADIOGRAPHIC FINDINGS:  Serum glucose is 122, BUN 4, creatinine 0.82, serum sodium 138, potassium 3.5, chloride 101, bicarbonate 27, calcium 8.3, lipase 158, serum albumin 2.7, total bilirubin 14.3, alkaline phosphatase 110, AST 69, and ALT is 25. Troponin is negative. White blood cell count is 11.6, hemoglobin 9.4, hematocrit 27.6, and MCV is 115. INR is 2.   Chest x-ray shows mild congestive heart failure.   PHYSICAL EXAMINATION: VITAL SIGNS:  Temperature is 99, pulse 106, respirations 22, and blood pressure is 128/74. Pulse oximetry is 93% on room air.  GENERAL:  The patient is alert and oriented x  3 and in no apparent distress.  HEENT:  Normocephalic, atraumatic. Pupils are equal, round, and reactive to light and accommodation. Extraocular movements are intact. Mucous membranes are moist. There is scleral icterus present.  NECK:  Trachea is midline. No adenopathy.  CHEST:  Symmetric, atraumatic.  CARDIOVASCULAR:  Regular rate and rhythm. Normal S1 and S2. No rubs, clicks, or murmurs appreciated.  LUNGS:  Clear to auscultation bilaterally. Normal effort and  excursion.  ABDOMEN:  Positive bowel sounds. Soft. Diffusely tender, but more so in the lower quadrants bilaterally. There is no guarding. No rebound tenderness. There is no splenomegaly. I cannot exactly palpate [name] liver border, but the patient does have some tenderness on the posterior border of the lowest right rib. There is tense ascites with fluid wave of the abdomen.  GENITOURINARY:  Deferred.  MUSCULOSKELETAL:  The patient moves all 4 extremities equally. She has 5/5 strength in upper and lower extremities bilaterally.  SKIN:  Jaundice. There are no lesions.  EXTREMITIES:  No clubbing, cyanosis, or edema.  NEUROLOGIC:  Cranial nerves II through XII are grossly intact.  PSYCHIATRIC:  Mood is normal. Affect is congruent.   ASSESSMENT AND PLAN:  This is [name] 64 year old female admitted for cirrhotic ascites.   1.  Ascites. It is tense, and the patient is uncomfortable. We will try to manage her pain with morphine. She underwent paracentesis recently and was discharged 2 days later, which presumably means that she did not have any evidence of spontaneous bacterial peritonitis. Her clinical exam today confirms this. She is on Flagyl for prophylaxis, which we will continue. At this time, she is afebrile and with [name] minimal leukocytosis. Thus, we will not add broad-spectrum antibiotics at this time. We will plan for interventional radiology to perform [name] therapeutic paracentesis, but her INR is currently 2. I have ordered 2 units of fresh frozen plasma to decrease her INR prior to her tap.  2.  Sepsis. The patient meets criteria by heart rate, respirations, and barely by leukocytosis. If she develops fever, hypotension, or confusion, I will add IV antibiotic coverage.  3.  Alcohol abuse. Her last drink was 11 days ago. There is no concern for withdrawal at this time.  4.  Cirrhosis. Her Model for End-Stage Liver Disease score is 22. There is no evidence of esophageal varices. I do not see any diuretics on  the patient's medication list, so it is unclear if she is resistant to therapy. I will add spironolactone and Lasix.  5.  Jaundice is cholestatic secondary to cirrhosis.  6.  Diabetes type 2. Sliding scale insulin while the patient is hospitalized.  7.  Hypothyroidism. Continue Synthroid.  8.  Deep vein thrombosis prophylaxis with heparin.  9.  Gastrointestinal prophylaxis with pantoprazole, presumably due to history of gastrointestinal bleed.   CODE STATUS:  The patient is [name] full code, although we need to discuss her code status, as she currently has hospice care at home.   TIME SPENT ON ADMISSION ORDERS AND PATIENT CARE:  Approximately 40 minutes.    ____________________________ Kelton Pillar. Sheryle Hail, MD msd:nb D: 08/13/2014 04:02:51 ET T: 08/13/2014 05:15:39 ET JOB#: [id number]  cc: Kelton Pillar. Sheryle Hail, MD, <Dictator> Kelton Pillar Eben Choinski MD ELECTRONICALLY SIGNED 08/14/2014 0:27

## 2015-01-12 NOTE — Discharge Summary (Signed)
PATIENT NAME:  Jenna [name], Jenna [name] MR#:  [id number] DATE OF BIRTH:  10-25-1950  DATE OF ADMISSION:  05/04/2014 DATE OF DISCHARGE:  05/07/2014  ADMITTING DIAGNOSIS: Gastrointestinal bleeding.  ASSOCIATED DIAGNOSES: 1. Acute anemia due to upper gastrointestinal bleed.  2. Portal hypertensive gastropathy.  3. Grade 1 esophageal varices.  4. Alcohol abuse.  5. Alcoholic hepatitis.  6. Cirrhosis due to alcohol abuse.  7. 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  8. Hypothyroidism.   CONSULTATIONS: Gastroenterology, Dr. Lynnae Prudeobert Elliott.   PROCEDURES:  1. Upper GI endoscopy performed on 05/05/2014, shows mild Schatzki ring, hiatal hernia, portal hypertensive gastropathy, normal duodenum, grade 1 esophageal varices.  2. Chest x-ray 05/05/2014, shows increased interstitial edema with gas filled loops of dilated small bowel.  3. Chest x-ray 05/04/2014, shows mild vascular congestion with lungs clear.  4. CT of the head without contrast performed due to dizziness shows no acute intracranial abnormality. Advanced cerebral and cerebellar atrophy with chronic microvascular ischemic change.   ADMITTING HISTORY OF PRESENT ILLNESS: This 64 year old female presents to the Emergency Room from alcohol rehabilitation facility after feeling dizzy and weak. The patient's last drink of alcohol was 24 hours prior to presentation. She reported that she gotten up to walk and she became dizzy and felt as if she may collapse. She did not lose consciousness or injure herself in the fall. In the Emergency Room, her stool was Hemoccult positive and hemoglobin had dropped. She was admitted for GI bleed.   HOSPITAL COURSE BY TREATMENT:  Problems:  1. Gastrointestinal bleeding most likely due to portal hypertensive gastropathy and grade 1 varices. Upper EGD did not show any active bleeding. Her hemoglobin dropped initially in the first 48 hours of admission from 9 to 8 g/dL and then remained  stable for the remainder of this admission.  2. Alcohol abuse: The patient was maintained on the CIWA protocol and did not show significant signs of withdrawal during this hospitalization. She discussed returning to inpatient rehabilitation with clinical social work but at the end of her hospitalization she decided to return home. She was given resources for both inpatient and intensive outpatient treatment for alcohol withdrawal/cessation. I personally counseled the patient and her family members regarding the need for support in the process of alcohol cessation. She understands the health implications of continued alcohol use.  3. Alcoholic hepatitis. On presentation, the patient's LFTs were elevated at an AST of 90 and ALT of 22, alkaline phosphatase of 144. Throughout the admission her LFTs trended downwards and at the time of discharge AST is 48, ALT is 16. However, due to an elevated Maddrey discrimination factor of 49, she was started on oral prednisolone. She is discharged on continued oral prednisolone at 40 mg daily for 20 days and then taper to 5 mg for 10 days and then stop.  4. Cirrhosis due to alcohol abuse with signs of portal hypertension, development of esophageal varices and ascites. The patient was counseled extensively regarding alcohol cessation.  5. Fever: Immediately following her EGD, the patient developed [name] transient fever of 101.7 along with hypoxia. At that time, chest x-ray showed increased interstitial edema suggestive of either aspiration or respiratory suppression during the procedure in this patient with ascites concern also for possible spontaneous bacterial peritonitis. She never developed any abdominal pain or other peritoneal signs. She was covered with Rocephin while inpatient and transitioned to Levaquin upon discharge.  6. 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 This remained stable throughout the  discharge.  7. Hypothyroidism: Her TSH  was elevated upon admission to 7.0. She had not been taking Synthroid at home. This was resumed at 50 mcg per day.   DISCHARGE LABS AND EXAMINATION:  VITAL SIGNS: Temperature is 99.3, pulse is 91, respirations 18, blood pressure 120/70, oxygen saturation 94% on room air.  GENERAL: The patient is alert, oriented, sitting up in bed, in no distress.  HEENT: Pupils are equal, round, and reactive, oral mucosa are pink and moist, oropharynx is clear. There is scleral icterus.  RESPIRATORY: Lungs are clear to auscultation bilaterally with good air movement.  CARDIOVASCULAR: Regular rate and rhythm. There is [name] 3/6 systolic ejection murmur, no lower extremity edema, no JVD.  ABDOMEN: Soft, nontender. It distended with [name] fluid wave. Bowel sounds are normal.  SKIN: Skin is normal to palpation with no rash. She does have jaundice.  NEUROLOGIC: Cranial nerves are intact. Motor, sensory function is intact.  PSYCHIATRIC: The patient is alert and oriented to time, place, person, good insight. She does appear slightly depressed.   LABORATORY DATA: On the day of discharge, sodium 139, potassium 3.9, chloride 103, bicarbonate 27, BUN is 6, creatinine is 0.63. Glucose is 110, alkaline phosphatase is 119, ALT is 16, AST is 48, total protein 5.9, albumin 2.8. White blood cells 6.5, hemoglobin 7.9, platelets 59,000, MCV is 125.  CONDITION ON DISCHARGE: Stable.   DISPOSITION: The patient is discharged to home. She was counseled extensively regarding the recommendation that she re enter inpatient rehabilitation for alcohol cessation, but she declined. She has the support of her daughter. She was seen by physical therapy and no additional physical therapy will as recommended.   FOLLOWUP: The patient is advised to follow up with the primary care provider. She was given information on the [facility]. She is advised to follow with [name] primary care provider within the next 2 weeks.   DISCHARGE MEDICATIONS:  1.  Prednisolone 15 mg/5 mL oral syrup. Instructions: 13.3 mL orally once [name] day for 20 days and then 5 mL for 10 days and then stop.  2. Levothyroxine 50 mcg 1 tablet once [name] day.  3. Pantoprazole 40 mg 1 tablet once [name] day.  4. Multivitamin 1 tablet daily.  5. Levofloxacin 750 mg oral tablet every 24 hours for 5 days and then stop.   DIET: Low sodium diet is advised.   ACTIVITY: No activity restrictions.   FOLLOWUP: As discussed above, she is to follow up with her primary care provider within 2 weeks after discharge.   time spent on discharge 45 minutes  ____________________________ Ena Dawley. Clent Ridges, MD cpw:lt D: 05/07/2014 15:33:00 ET T: 05/07/2014 20:05:38 ET JOB#: [id number]  cc: Santina Evans P. Clent Ridges, MD, <Dictator> Gale Journey MD ELECTRONICALLY SIGNED 05/08/2014 14:49

## 2015-01-12 NOTE — Consult Note (Signed)
I agree with your plans for discharge on Levaquin.  I will sign off.  Electronic Signatures: Scot JunElliott, Robert T (MD)  (Signed on 17-Aug-15 12:08)  Authored  Last Updated: 17-Aug-15 12:08 by Scot JunElliott, Robert T (MD)

## 2015-01-12 NOTE — Consult Note (Signed)
Awake, alert, oriented, family helping with grooming.  CXR showed small effusion with increased interstitial edema.  Blood cult no growth. chest with decreased breath sounds on right.  Afebrile now, started on Rocephin for possible SBP given fever yesterday.  Atelectasis also a possible cause.  Hgb 7.8, plt 64, WBC 7.2, plt 127, urine neg.   Recommend avoid NSAID in cirrhotic if possible, keep tylenol 2 grams or less. Possible tramadol 25-50mg  if needed for pain.  Looks better each day.  No new suggestions.  Electronic Signatures: Scot JunElliott, Chrisy Hillebrand T (MD)  (Signed on 16-Aug-15 09:20)  Authored  Last Updated: 16-Aug-15 09:20 by Scot JunElliott, Jonel Sick T (MD)

## 2015-01-12 NOTE — Consult Note (Signed)
Pt feeling better, eating better, no bowel movement per her observation. She takes stool softeners and laxatives at home.  Likely will resume alcohol when goes home.  Electronic Signatures: Scot JunElliott, Cherita Hebel T (MD)  (Signed on 17-Aug-15 12:54)  Authored  Last Updated: 17-Aug-15 12:54 by Scot JunElliott, Yailyn Strack T (MD)
